# Patient Record
Sex: Female | Born: 1950 | Race: White | Hispanic: No | Marital: Married | Smoking: Never smoker
Health system: Southern US, Community
[De-identification: ages and names within clinical notes are randomized; demographics above are authoritative.]

## PROBLEM LIST (undated history)

## (undated) DIAGNOSIS — F419 Anxiety disorder, unspecified: Secondary | ICD-10-CM

## (undated) DIAGNOSIS — N189 Chronic kidney disease, unspecified: Secondary | ICD-10-CM

## (undated) DIAGNOSIS — Q600 Renal agenesis, unilateral: Secondary | ICD-10-CM

## (undated) DIAGNOSIS — K649 Unspecified hemorrhoids: Secondary | ICD-10-CM

## (undated) DIAGNOSIS — I10 Essential (primary) hypertension: Secondary | ICD-10-CM

## (undated) DIAGNOSIS — F341 Dysthymic disorder: Secondary | ICD-10-CM

## (undated) DIAGNOSIS — L900 Lichen sclerosus et atrophicus: Secondary | ICD-10-CM

## (undated) DIAGNOSIS — N63 Unspecified lump in unspecified breast: Secondary | ICD-10-CM

## (undated) HISTORY — DX: Chronic kidney disease, unspecified: N18.9

## (undated) HISTORY — DX: Dysthymic disorder: F34.1

## (undated) HISTORY — DX: Anxiety disorder, unspecified: F41.9

## (undated) HISTORY — DX: Essential (primary) hypertension: I10

## (undated) HISTORY — PX: NASAL POLYP SURGERY: SHX186

## (undated) HISTORY — DX: Renal agenesis, unilateral: Q60.0

## (undated) HISTORY — PX: ABDOMINAL HYSTERECTOMY: SHX81

## (undated) HISTORY — DX: Unspecified hemorrhoids: K64.9

## (undated) HISTORY — DX: Lichen sclerosus et atrophicus: L90.0

## (undated) HISTORY — DX: Unspecified lump in unspecified breast: N63.0

---

## 2007-02-07 ENCOUNTER — Ambulatory Visit: Payer: Self-pay | Admitting: Family Medicine

## 2008-04-09 ENCOUNTER — Ambulatory Visit: Payer: Self-pay | Admitting: Family Medicine

## 2009-06-10 ENCOUNTER — Ambulatory Visit: Payer: Self-pay | Admitting: Family Medicine

## 2009-06-12 ENCOUNTER — Ambulatory Visit: Payer: Self-pay | Admitting: Family Medicine

## 2009-10-16 ENCOUNTER — Ambulatory Visit: Payer: Self-pay | Admitting: Family Medicine

## 2010-03-22 HISTORY — PX: BREAST CYST ASPIRATION: SHX578

## 2010-12-21 ENCOUNTER — Ambulatory Visit: Payer: Self-pay | Admitting: Family Medicine

## 2012-01-05 IMAGING — US ULTRASOUND LEFT BREAST
1 series · 17 of 25 positions shown · non-contrast
Comparison: none

REASON FOR EXAM: lt brst cyst 2 and 3 oclock and yearly
COMMENTS:

[Series 1: ultrasound left breast · 17 of 59 slices shown]
[im 1/59]
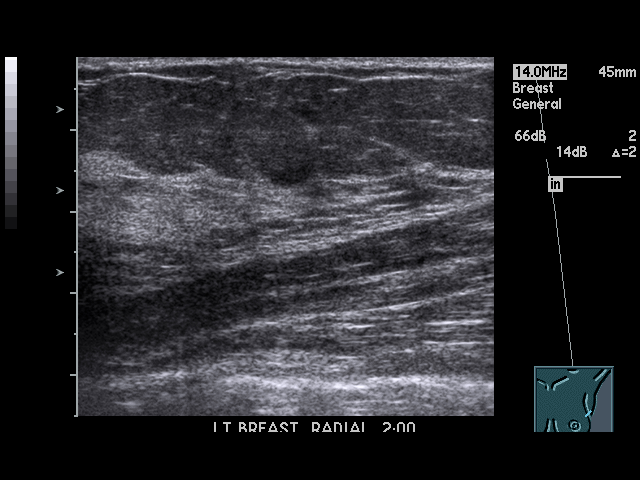
[im 5/59]
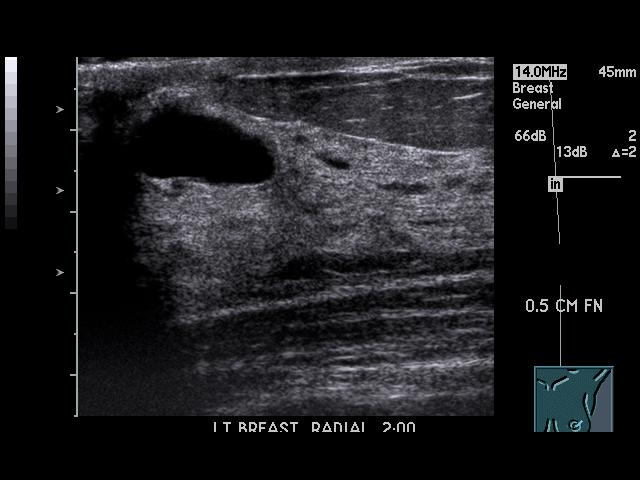
[im 8/59]
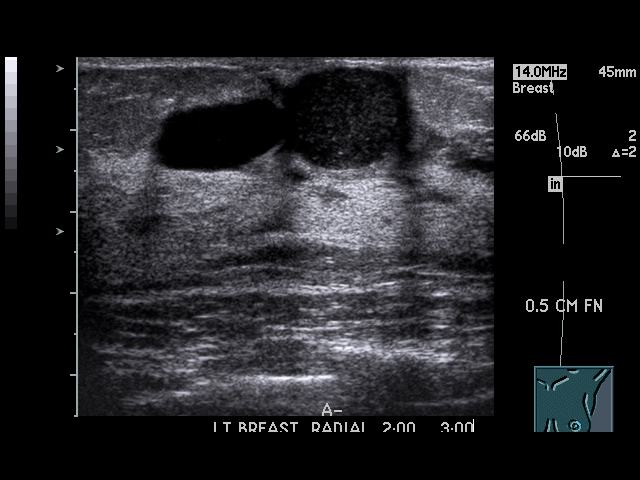
[im 13/59]
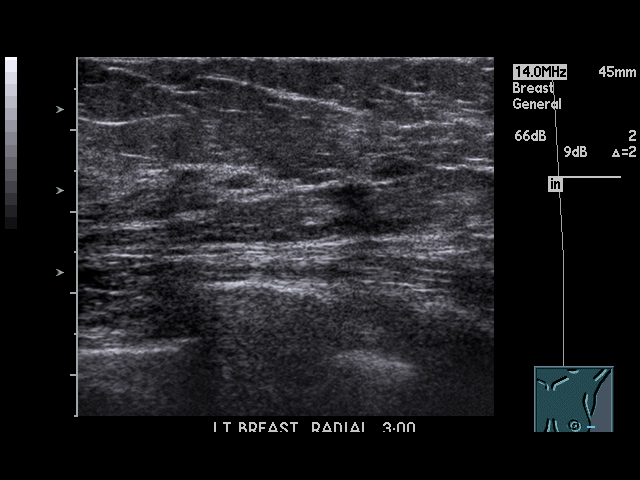
[im 15/59]
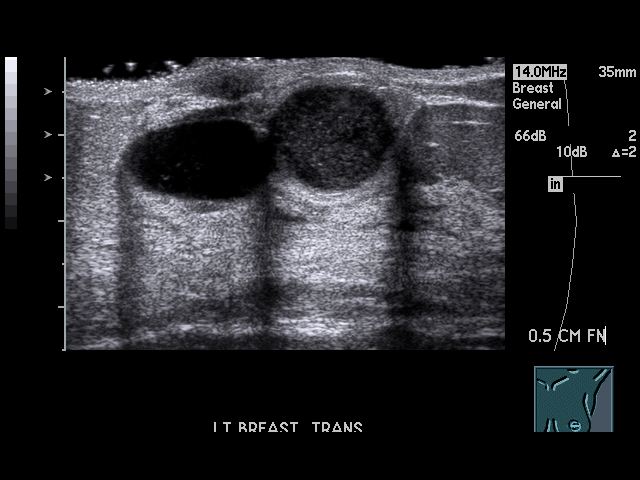
[im 20/59]
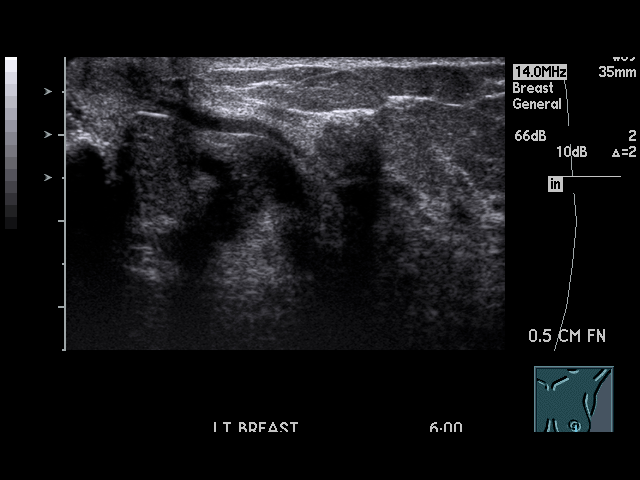
[im 22/59]
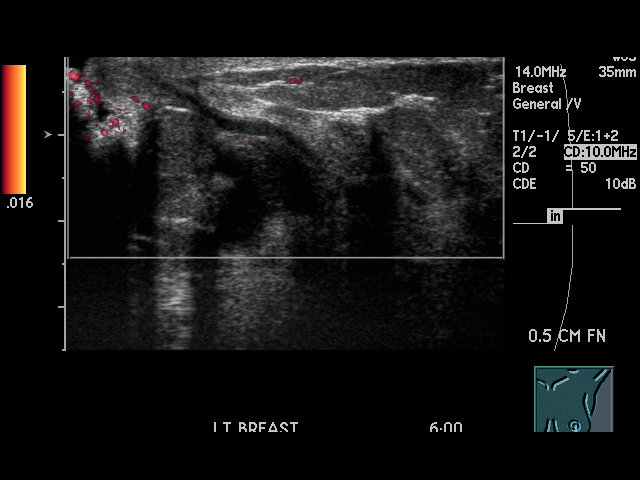
[im 27/59]
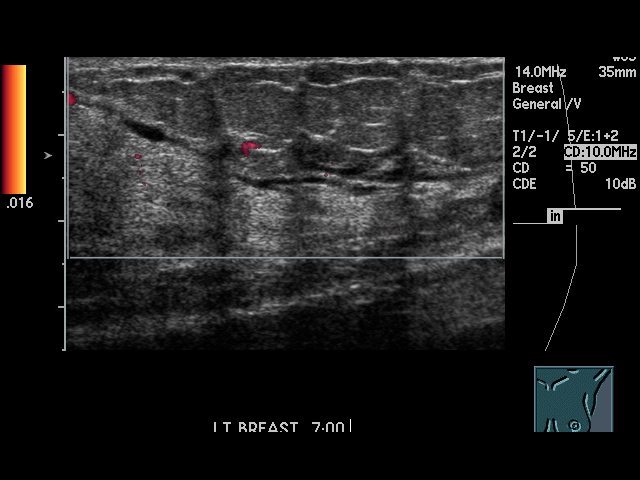
[im 30/59]
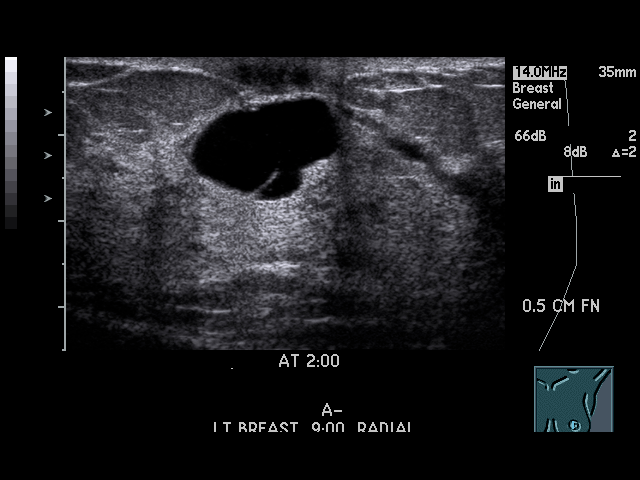
[im 32/59]
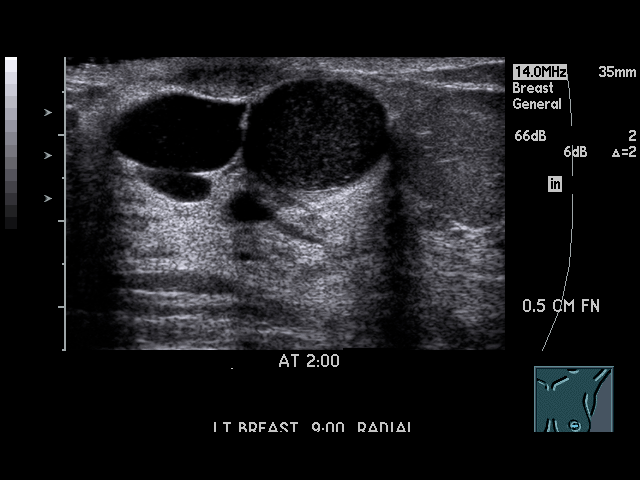
[im 37/59]
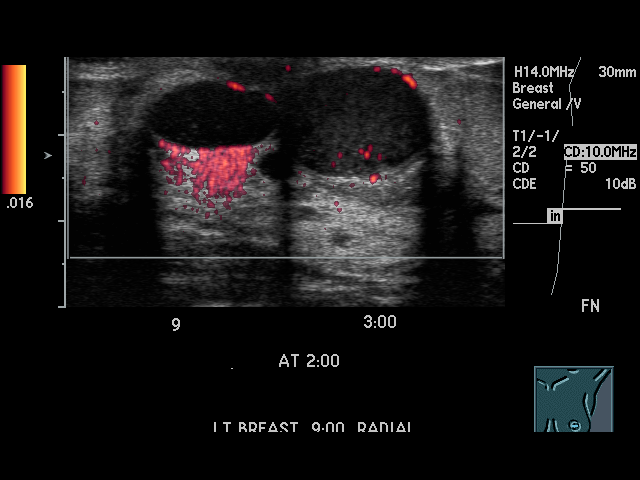
[im 39/59]
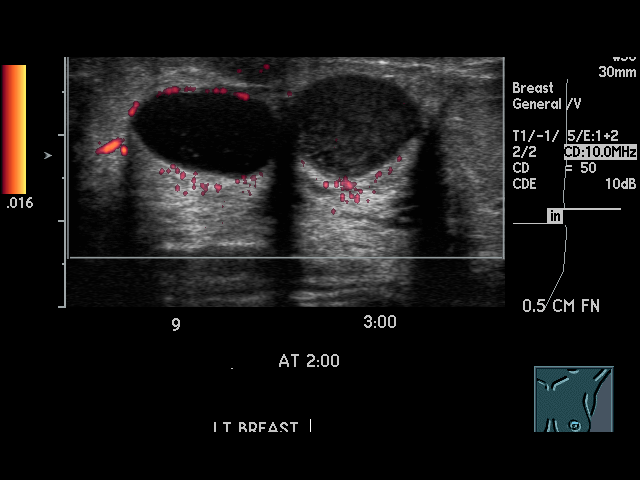
[im 44/59]
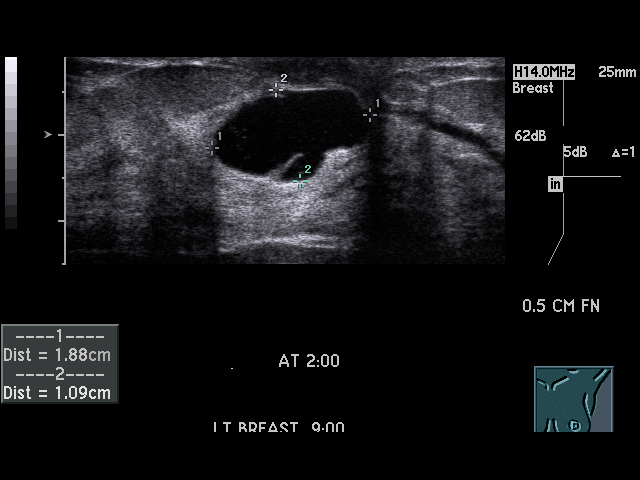
[im 46/59]
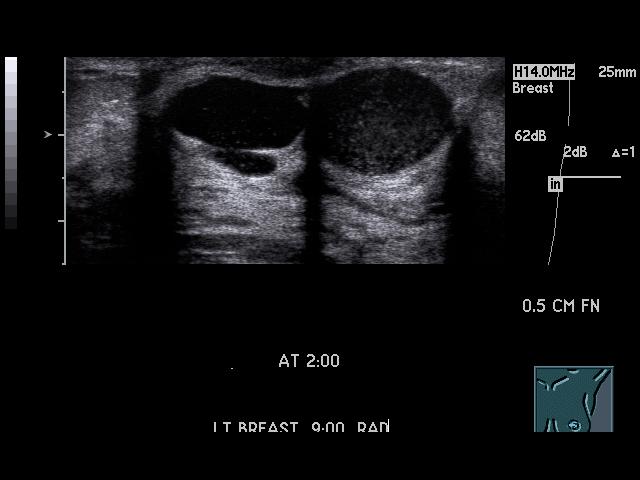
[im 51/59]
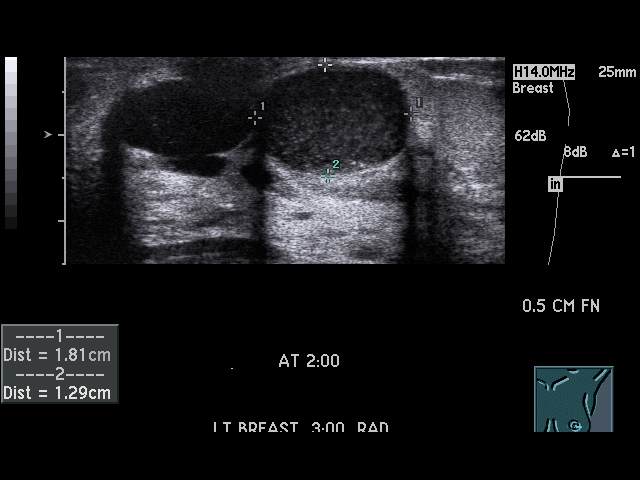
[im 54/59]
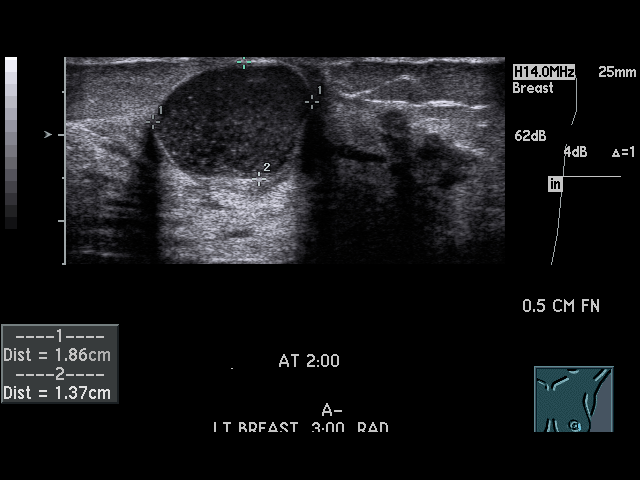
[im 59/59]
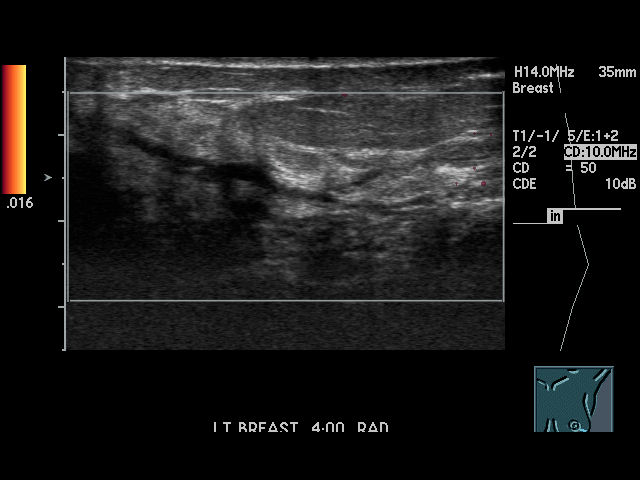

[17 of 25 positions shown; findings below may reference images not displayed]

PROCEDURE:     US  - US BREAST LEFT  - December 21, 2010  [DATE]

RESULT:     Left Breast Ultrasound and additional views of the left breast
were obtained at the time of the patient's visit and not evaluated on the
prior report. Noted in the retroareolar portion of the left breast are
prominent nodular densities. Ultrasound revealed two, approximately 1.9 cm
cystic lesions that contain septations and debris. Prominent adjacent ducts
are noted. Although these lesions are most likely benign,malignancy can not
be excluded.Surgical consultation suggested.
IMPRESSION: BIRADS:4.Surgical consultation suggested.

## 2012-11-20 ENCOUNTER — Emergency Department: Payer: Self-pay | Admitting: Emergency Medicine

## 2012-11-20 LAB — COMPREHENSIVE METABOLIC PANEL
Albumin: 4.5 g/dL (ref 3.4–5.0)
Alkaline Phosphatase: 106 U/L (ref 50–136)
BUN: 17 mg/dL (ref 7–18)
Bilirubin,Total: 0.7 mg/dL (ref 0.2–1.0)
Chloride: 106 mmol/L (ref 98–107)
Co2: 24 mmol/L (ref 21–32)
Creatinine: 1.14 mg/dL (ref 0.60–1.30)
EGFR (Non-African Amer.): 51 — ABNORMAL LOW
Glucose: 113 mg/dL — ABNORMAL HIGH (ref 65–99)
Potassium: 3.8 mmol/L (ref 3.5–5.1)
SGOT(AST): 29 U/L (ref 15–37)
Sodium: 139 mmol/L (ref 136–145)

## 2012-11-20 LAB — URINALYSIS, COMPLETE
Glucose,UR: NEGATIVE mg/dL (ref 0–75)
Nitrite: NEGATIVE
Ph: 7 (ref 4.5–8.0)
RBC,UR: NONE SEEN /HPF (ref 0–5)
Squamous Epithelial: 1

## 2012-11-20 LAB — CK TOTAL AND CKMB (NOT AT ARMC)
CK, Total: 88 U/L (ref 21–215)
CK-MB: 1.7 ng/mL (ref 0.5–3.6)

## 2012-11-20 LAB — TROPONIN I: Troponin-I: <0.02 ng/mL

## 2012-11-20 LAB — CBC
MCHC: 35 g/dL (ref 32.0–36.0)
MCV: 92 fL (ref 80–100)
Platelet: 129 10*3/uL — ABNORMAL LOW (ref 150–440)
RDW: 12.3 % (ref 11.5–14.5)
WBC: 13 10*3/uL — ABNORMAL HIGH (ref 3.6–11.0)

## 2013-03-19 ENCOUNTER — Ambulatory Visit: Payer: Self-pay | Admitting: Gastroenterology

## 2013-03-30 ENCOUNTER — Ambulatory Visit: Payer: Self-pay | Admitting: Gastroenterology

## 2013-04-04 LAB — PATHOLOGY REPORT

## 2014-07-23 ENCOUNTER — Other Ambulatory Visit: Payer: Self-pay | Admitting: Family Medicine

## 2014-07-23 DIAGNOSIS — R928 Other abnormal and inconclusive findings on diagnostic imaging of breast: Secondary | ICD-10-CM

## 2014-08-15 ENCOUNTER — Ambulatory Visit: Payer: Self-pay

## 2014-08-16 ENCOUNTER — Ambulatory Visit: Payer: Self-pay

## 2014-08-16 ENCOUNTER — Ambulatory Visit

## 2014-08-16 ENCOUNTER — Ambulatory Visit
Admission: RE | Admit: 2014-08-16 | Discharge: 2014-08-16 | Disposition: A | Discharge status: Home / Self Care | Source: Ambulatory Visit | Attending: Family Medicine | Admitting: Family Medicine

## 2014-08-16 DIAGNOSIS — R928 Other abnormal and inconclusive findings on diagnostic imaging of breast: Secondary | ICD-10-CM

## 2015-07-14 ENCOUNTER — Other Ambulatory Visit: Payer: Self-pay | Admitting: Family Medicine

## 2015-07-14 DIAGNOSIS — Z1231 Encounter for screening mammogram for malignant neoplasm of breast: Secondary | ICD-10-CM

## 2015-08-19 ENCOUNTER — Ambulatory Visit: Attending: Family Medicine

## 2015-08-25 ENCOUNTER — Ambulatory Visit
Admission: RE | Admit: 2015-08-25 | Discharge: 2015-08-25 | Disposition: A | Discharge status: Home / Self Care | Payer: Medicare Other | Source: Ambulatory Visit | Attending: Family Medicine | Admitting: Family Medicine

## 2015-08-25 ENCOUNTER — Other Ambulatory Visit: Payer: Self-pay | Admitting: Family Medicine

## 2015-08-25 DIAGNOSIS — Z1231 Encounter for screening mammogram for malignant neoplasm of breast: Secondary | ICD-10-CM

## 2015-08-25 DIAGNOSIS — R928 Other abnormal and inconclusive findings on diagnostic imaging of breast: Secondary | ICD-10-CM | POA: Diagnosis not present

## 2015-09-02 ENCOUNTER — Other Ambulatory Visit: Payer: Self-pay | Admitting: Family Medicine

## 2015-09-02 DIAGNOSIS — R928 Other abnormal and inconclusive findings on diagnostic imaging of breast: Secondary | ICD-10-CM

## 2015-09-09 ENCOUNTER — Ambulatory Visit
Admission: RE | Admit: 2015-09-09 | Discharge: 2015-09-09 | Disposition: A | Discharge status: Home / Self Care | Payer: Medicare Other | Source: Ambulatory Visit | Attending: Family Medicine | Admitting: Family Medicine

## 2015-09-09 DIAGNOSIS — N6002 Solitary cyst of left breast: Secondary | ICD-10-CM | POA: Insufficient documentation

## 2015-09-09 DIAGNOSIS — R928 Other abnormal and inconclusive findings on diagnostic imaging of breast: Secondary | ICD-10-CM

## 2015-09-09 DIAGNOSIS — N63 Unspecified lump in breast: Secondary | ICD-10-CM | POA: Insufficient documentation

## 2015-10-09 ENCOUNTER — Ambulatory Visit (INDEPENDENT_AMBULATORY_CARE_PROVIDER_SITE_OTHER): Payer: Medicare Other | Admitting: Psychiatry

## 2015-10-09 ENCOUNTER — Encounter: Payer: Self-pay | Admitting: Psychiatry

## 2015-10-09 VITALS — BP 151/73 | HR 61 | Temp 97.6°F | Ht 64.0 in | Wt 161.8 lb

## 2015-10-09 DIAGNOSIS — I5181 Takotsubo syndrome: Secondary | ICD-10-CM | POA: Insufficient documentation

## 2015-10-09 DIAGNOSIS — K649 Unspecified hemorrhoids: Secondary | ICD-10-CM | POA: Insufficient documentation

## 2015-10-09 DIAGNOSIS — Q600 Renal agenesis, unilateral: Secondary | ICD-10-CM | POA: Insufficient documentation

## 2015-10-09 DIAGNOSIS — I10 Essential (primary) hypertension: Secondary | ICD-10-CM | POA: Insufficient documentation

## 2015-10-09 DIAGNOSIS — F4323 Adjustment disorder with mixed anxiety and depressed mood: Secondary | ICD-10-CM | POA: Diagnosis not present

## 2015-10-09 DIAGNOSIS — F341 Dysthymic disorder: Secondary | ICD-10-CM | POA: Insufficient documentation

## 2015-10-09 NOTE — Progress Notes (Signed)
Psychiatric Initial Adult Assessment   Patient Identification: Patricia Cowan MRN:  [id number] Date of Evaluation:  10/09/2015 [facility] [place] Chief Complaint:   Chief Complaint    Establish Care; Panic Attack; Stress     Visit Diagnosis:    ICD-9-CM ICD-10-CM   1. Adjustment disorder with mixed anxiety and depressed mood 309.28 F43.23     History of Present Illness:    Patient is a 65 year old married female who presented for initial assessment. She was referred by her primary care physician. Patient reported that she has been having several stressors in her life. She stated that she has been stressed out because of her husband who was diagnosed with her for a risk or disease and lost his leg earlier this year. She he had 2 surgeries and also had 2 seizures. She reported that she is also having stress related to her daughter who was married for almost 15 years and then she is getting divorce related to her alcohol use as well as relationship issues with her husband. Her daughter has 3 children ages 267, 115 and 66 years old. Her daughter has started drinking excessively and going to the bars on a daily basis. Patient was sad and tearful due to the behavior of her daughter. She reported that her daughter is not listening to any of the family members and they have been trying to explain to her about the excessive use of alcohol as well as the consequences. She reported that she has also tried to take the kids to the bar and they stated to them that other people also bring the kids to the bar in the [place] area. She reported that she feels stressed out. Her husband was a nice man but he could not tolerate the behavior of her daughter. Patient reported that she wants help for her daughter. She remained focus on the issues related to her daughter. She reported that she wants help for her. Patient reported that she has been taking Lexapro as prescribed by her primary physician and is  doing well on that medication. She currently denied having any suicidal homicidal ideations or plans.  Associated Signs/Symptoms: Depression Symptoms:  depressed mood, fatigue, feelings of worthlessness/guilt, difficulty concentrating, hopelessness, anxiety, disturbed sleep, (Hypo) Manic Symptoms:  Labiality of Mood, Anxiety Symptoms:  Excessive Worry, Psychotic Symptoms:  none PTSD Symptoms: Had a traumatic exposure:  son died 15 years ago- he was 66 years old, he was intoxicated  Past Psychiatric History:  Pt  reported that she has been in therapy through [facility] related to her relationship problems with her husband in the past. She denied any history of suicide attempts. She denied any history of psychiatric hospitalization.  Previous Psychotropic Medications: lexapro   Substance Abuse History in the last 12 months:  No.  Consequences of Substance Abuse: Negative NA  Past Medical History:  Past Medical History  Diagnosis Date  . Chronic kidney disease   . HTN (hypertension)   . Congenital single kidney   . Hemorrhoid   . Dysthymia     Past Surgical History  Procedure Laterality Date  . Breast cyst aspiration Left 2012    neg (performed 2 times)  . Nasal polyp surgery    . Abdominal hysterectomy      Family Psychiatric History:  ADD- Daughter, brother Depression- Mother  Son and daughter- alcohol use  Family History:  Family History  Problem Relation Age of Onset  . Breast cancer Paternal Aunt [id number]  .  Breast cancer Cousin   . Depression Mother   . Depression Sister   . ADD / ADHD Brother     Social History:   Social History   Social History  . Marital Status: Married    Spouse Name: N/A  . Number of Children: N/A  . Years of Education: N/A   Social History Main Topics  . Smoking status: Never Smoker   . Smokeless tobacco: Never Used  . Alcohol Use: 0.6 - 1.8 oz/week    0 Standard drinks or equivalent, 1-2 Cans of beer, 0-1 Glasses of wine per  week  . Drug Use: No  . Sexual Activity: Yes    Birth Control/ Protection: None   Other Topics Concern  . None   Social History Narrative    Additional Social History:  Married x 2- First husband left when daughter was 7 months old.  15 years ago lost son - car accident Has  2 daughters.  Lives with husband.   Allergies:   Allergies  Allergen Reactions  . Codeine Hives  . Penicillins Rash    Metabolic Disorder Labs: No results found for: HGBA1C, MPG No results found for: PROLACTIN No results found for: CHOL, TRIG, HDL, CHOLHDL, VLDL, LDLCALC   Current Medications: Current Outpatient Prescriptions  Medication Sig Dispense Refill  . aspirin 81 MG tablet Take 81 mg by mouth daily.    . calcium-vitamin D 250-100 MG-UNIT tablet Take 1 tablet by mouth 2 (two) times daily.    Marland Kitchen escitalopram (LEXAPRO) 20 MG tablet     . glucosamine-chondroitin 500-400 MG tablet Take 1 tablet by mouth 3 (three) times daily.    Marland Kitchen lisinopril (PRINIVIL,ZESTRIL) 10 MG tablet     . magnesium 30 MG tablet Take 30 mg by mouth 2 (two) times daily.    . Methylsulfonylmethane (MSM) 750 MG CAPS Take by mouth.    . Omega-3 Fatty Acids (FISH OIL) 1000 MG CAPS Take by mouth.    Marland Kitchen omeprazole (PRILOSEC) 20 MG capsule     . VESICARE 5 MG tablet      No current facility-administered medications for this visit.    Neurologic: Headache: No Seizure: No Paresthesias:No  Musculoskeletal: Strength & Muscle Tone: within normal limits Gait & Station: normal Patient leans: N/A  Psychiatric Specialty Exam: ROS  Blood pressure 151/73, pulse 61, temperature 97.6 F (36.4 C), temperature source Oral, height 5\' 4"  (1.626 m), weight 161 lb 12.8 oz (73.392 kg).Body mass index is 27.76 kg/(m^2).  General Appearance: Casual  Eye Contact:  Fair  Speech:  Slow  Volume:  Decreased  Mood:  Anxious and Depressed  Affect:  Tearful  Thought Process:  Coherent  Orientation:  Full (Time, Place, and Person)  Thought  Content:  WDL  Suicidal Thoughts:  No  Homicidal Thoughts:  No  Memory:  Immediate;   Fair Recent;   Fair  Judgement:  Good  Insight:  Fair  Psychomotor Activity:  Normal  Concentration:  Concentration: Fair and Attention Span: Fair  Recall:  [employer] of Knowledge:Fair  Language: Fair  Akathisia:  No  Handed:  Right  AIMS (if indicated):    Assets:  Communication Skills Desire for Improvement Physical Health Social Support  ADL's:  Intact  Cognition: WNL  Sleep:      Treatment Plan Summary: Medication management   Patient reported that she is doing well on her medications and stable on the medications. She wants help for her daughter. She currently denied any suicidal  ideation or plans. Joni Reining will talk to her about resources including [facility] and [facility] health for her daughter.  Will follow-up as needed.   More than 50% of the time spent in psychoeducation, counseling and coordination of care.    This note was generated in part or whole with voice recognition software. Voice regonition is usually quite accurate but there are transcription errors that can and very often do occur. I apologize for any typographical errors that were not detected and corrected.    Brandy Hale, MD 7/20/20173:10 PM

## 2016-02-02 ENCOUNTER — Emergency Department
Admission: EM | Admit: 2016-02-02 | Discharge: 2016-02-02 | Disposition: A | Discharge status: Home / Self Care | Payer: Medicare Other | Attending: Emergency Medicine | Admitting: Emergency Medicine

## 2016-02-02 ENCOUNTER — Encounter: Payer: Self-pay | Admitting: Emergency Medicine

## 2016-02-02 DIAGNOSIS — N189 Chronic kidney disease, unspecified: Secondary | ICD-10-CM | POA: Insufficient documentation

## 2016-02-02 DIAGNOSIS — Z79899 Other long term (current) drug therapy: Secondary | ICD-10-CM | POA: Diagnosis not present

## 2016-02-02 DIAGNOSIS — I129 Hypertensive chronic kidney disease with stage 1 through stage 4 chronic kidney disease, or unspecified chronic kidney disease: Secondary | ICD-10-CM | POA: Diagnosis not present

## 2016-02-02 DIAGNOSIS — Z7982 Long term (current) use of aspirin: Secondary | ICD-10-CM | POA: Insufficient documentation

## 2016-02-02 DIAGNOSIS — R112 Nausea with vomiting, unspecified: Secondary | ICD-10-CM | POA: Diagnosis present

## 2016-02-02 DIAGNOSIS — K529 Noninfective gastroenteritis and colitis, unspecified: Secondary | ICD-10-CM | POA: Insufficient documentation

## 2016-02-02 LAB — COMPREHENSIVE METABOLIC PANEL
ALBUMIN: 4.3 g/dL (ref 3.5–5.0)
ALK PHOS: 84 U/L (ref 38–126)
ALT: 34 U/L (ref 14–54)
AST: 32 U/L (ref 15–41)
Anion gap: 11 (ref 5–15)
BUN: 20 mg/dL (ref 6–20)
CALCIUM: 9.6 mg/dL (ref 8.9–10.3)
CHLORIDE: 102 mmol/L (ref 101–111)
CO2: 24 mmol/L (ref 22–32)
CREATININE: 0.93 mg/dL (ref 0.44–1.00)
GFR calc Af Amer: >60 mL/min (ref >60)
GFR calc non Af Amer: >60 mL/min (ref >60)
GLUCOSE: 119 mg/dL — AB (ref 65–99)
Potassium: 4.1 mmol/L (ref 3.5–5.1)
SODIUM: 137 mmol/L (ref 135–145)
Total Bilirubin: 1.4 mg/dL — ABNORMAL HIGH (ref 0.3–1.2)
Total Protein: 7.4 g/dL (ref 6.5–8.1)

## 2016-02-02 LAB — URINALYSIS COMPLETE WITH MICROSCOPIC (ARMC ONLY)
Bacteria, UA: NONE SEEN
Bilirubin Urine: NEGATIVE
Glucose, UA: NEGATIVE mg/dL
Hgb urine dipstick: NEGATIVE
Leukocytes, UA: NEGATIVE
Nitrite: NEGATIVE
PROTEIN: 30 mg/dL — AB
Specific Gravity, Urine: 1.025 (ref 1.005–1.030)
pH: 5 (ref 5.0–8.0)

## 2016-02-02 LAB — CBC
HCT: 48.4 % — ABNORMAL HIGH (ref 35.0–47.0)
HEMOGLOBIN: 16.8 g/dL — AB (ref 12.0–16.0)
MCH: 32.3 pg (ref 26.0–34.0)
MCHC: 34.6 g/dL (ref 32.0–36.0)
MCV: 93.2 fL (ref 80.0–100.0)
Platelets: 152 10*3/uL (ref 150–440)
RBC: 5.2 MIL/uL (ref 3.80–5.20)
RDW: 12.5 % (ref 11.5–14.5)
WBC: 12.9 10*3/uL — ABNORMAL HIGH (ref 3.6–11.0)

## 2016-02-02 LAB — LIPASE, BLOOD: Lipase: 19 U/L (ref 11–51)

## 2016-02-02 MED ORDER — ACETAMINOPHEN 325 MG PO TABS
650.0000 mg | ORAL_TABLET | Freq: Once | ORAL | Status: AC
  Administered 2016-02-02: 650 mg via ORAL

## 2016-02-02 MED ORDER — SODIUM CHLORIDE 0.9 % IV BOLUS (SEPSIS)
1000.0000 mL | Freq: Once | INTRAVENOUS | Status: AC
  Administered 2016-02-02: 1000 mL via INTRAVENOUS

## 2016-02-02 MED ORDER — ONDANSETRON HCL 4 MG PO TABS: 4.0000 mg | ORAL_TABLET | Freq: Three times a day (TID) | ORAL | 0 refills | Status: DC | PRN

## 2016-02-02 MED ORDER — ONDANSETRON HCL 4 MG/2ML IJ SOLN
4.0000 mg | Freq: Once | INTRAMUSCULAR | Status: AC | PRN
  Administered 2016-02-02: 4 mg via INTRAVENOUS
  Filled 2016-02-02: qty 2

## 2016-02-02 MED ORDER — ACETAMINOPHEN 325 MG PO TABS
ORAL_TABLET | ORAL | Status: AC
  Filled 2016-02-02: qty 2

## 2016-02-02 MED ORDER — ONDANSETRON HCL 4 MG/2ML IJ SOLN
4.0000 mg | Freq: Once | INTRAMUSCULAR | Status: AC
  Administered 2016-02-02: 4 mg via INTRAVENOUS
  Filled 2016-02-02: qty 2

## 2016-02-02 NOTE — ED Notes (Signed)
Pt reports that she did not take her BP medications this am.

## 2016-02-02 NOTE — ED Provider Notes (Addendum)
[facility] Emergency Department Provider Note  ____________________________________________   I have reviewed the triage vital signs and the nursing notes.   HISTORY  Chief Complaint Abdominal Pain    HPI Patricia Cowan is a 65 y.o. female with a retention and mild kidney disease presents today complaining of nausea vomiting diarrhea since this morning. Nonbloody nonbilious. No melena bright red blood per rectum. No hematemesis. The patient states that she has had no recent antibiotics she denies abdominal pain she would like to try ice chips she feels dehydrated. She states that she has had no fever. She denies antibiotics or recent travel.       Past Medical History:  Diagnosis Date  . Chronic kidney disease   . Congenital single kidney   . Dysthymia   . Hemorrhoid   . HTN (hypertension)     Patient Active Problem List   Diagnosis Date Noted  . 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 Congenital absence of one kidney 10/09/2015  . Dysthymia 10/09/2015  . Hemorrhoid 10/09/2015  . BP (high blood pressure) 10/09/2015    Past Surgical History:  Procedure Laterality Date  . ABDOMINAL HYSTERECTOMY    . BREAST CYST ASPIRATION Left 2012   neg (performed 2 times)  . NASAL POLYP SURGERY      Prior to Admission medications   Medication Sig Start Date End Date Taking? Authorizing Provider  aspirin 81 MG tablet Take 81 mg by mouth daily.    Historical Provider, MD  calcium-vitamin D 250-100 MG-UNIT tablet Take 1 tablet by mouth 2 (two) times daily.    Historical Provider, MD  escitalopram (LEXAPRO) 20 MG tablet  09/23/15   Historical Provider, MD  glucosamine-chondroitin 500-400 MG tablet Take 1 tablet by mouth 3 (three) times daily.    Historical Provider, MD  lisinopril (PRINIVIL,ZESTRIL) 10 MG tablet  07/07/15   Historical Provider, MD  magnesium 30 MG tablet Take 30 mg by mouth 2 (two) times daily.    Historical Provider, MD   Methylsulfonylmethane (MSM) 750 MG CAPS Take by mouth.    Historical Provider, MD  Omega-3 Fatty Acids (FISH OIL) 1000 MG CAPS Take by mouth.    Historical Provider, MD  omeprazole (PRILOSEC) 20 MG capsule  08/19/15   Historical Provider, MD  VESICARE 5 MG tablet  10/05/15   Historical Provider, MD    Allergies Codeine and Penicillins  Family History  Problem Relation Age of Onset  . Breast cancer Cousin   . Depression Mother   . Depression Sister   . ADD / ADHD Brother   . Breast cancer Paternal Aunt 80    Social History Social History  Substance Use Topics  . Smoking status: Never Smoker  . Smokeless tobacco: Never Used  . Alcohol use 0.6 - 1.8 oz/week    1 - 2 Cans of beer per week    Review of Systems Constitutional: No fever/chills Eyes: No visual changes. ENT: No sore throat. No stiff neck no neck pain Cardiovascular: Denies chest pain. Respiratory: Denies shortness of breath. Gastrointestinal:   no vomiting.  No diarrhea.  No constipation. Genitourinary: Negative for dysuria. Musculoskeletal: Negative lower extremity swelling Skin: Negative for rash. Neurological: Negative for severe headaches, focal weakness or numbness. 10-point ROS otherwise negative.  ____________________________________________   PHYSICAL EXAM:  VITAL SIGNS: ED Triage Vitals [02/02/16 1801]  Enc Vitals Group     BP (!) 151/91     Pulse Rate 83     Resp 18  Temp 99.3 F (37.4 C)     Temp Source Oral     SpO2 97 %     Weight 158 lb (71.7 kg)     Height 5\' 3"  (1.6 m)     Head Circumference      Peak Flow      Pain Score 5     Pain Loc      Pain Edu?      Excl. in GC?     Constitutional: Alert and oriented. Well appearing and in no acute distress. Eyes: Conjunctivae are normal. PERRL. EOMI. Head: Atraumatic. Nose: No congestion/rhinnorhea. Mouth/Throat: Mucous membranes are Dry.  Oropharynx non-erythematous. Neck: No stridor.   Nontender with no  meningismus Cardiovascular: Normal rate, regular rhythm. Grossly normal heart sounds.  Good peripheral circulation. Respiratory: Normal respiratory effort.  No retractions. Lungs CTAB. Abdominal: Soft and nontender. No distention. No guarding no rebound Back:  There is no focal tenderness or step off.  there is no midline tenderness there are no lesions noted. there is no CVA tenderness  Musculoskeletal: No lower extremity tenderness, no upper extremity tenderness. No joint effusions, no DVT signs strong distal pulses no edema Neurologic:  Normal speech and language. No gross focal neurologic deficits are appreciated.  Skin:  Skin is warm, dry and intact. No rash noted. Psychiatric: Mood and affect are normal. Speech and behavior are normal.  ____________________________________________   LABS (all labs ordered are listed, but only abnormal results are displayed)  Labs Reviewed  COMPREHENSIVE METABOLIC PANEL - Abnormal; Notable for the following:       Result Value   Glucose, Bld 119 (*)    Total Bilirubin 1.4 (*)    All other components within normal limits  CBC - Abnormal; Notable for the following:    WBC 12.9 (*)    Hemoglobin 16.8 (*)    HCT 48.4 (*)    All other components within normal limits  URINALYSIS COMPLETEWITH MICROSCOPIC ([facility] ONLY) - Abnormal; Notable for the following:    Color, Urine YELLOW (*)    APPearance CLEAR (*)    Ketones, ur 1+ (*)    Protein, ur 30 (*)    Squamous Epithelial / LPF 0-5 (*)    All other components within normal limits  LIPASE, BLOOD   ____________________________________________  EKG  I personally interpreted any EKGs ordered by me or triage  ____________________________________________  RADIOLOGY  I reviewed any imaging ordered by me or triage that were performed during my shift and, if possible, patient and/or family made aware of any abnormal  findings. ____________________________________________   PROCEDURES  Procedure(s) performed: None  Procedures  Critical Care performed: None  ____________________________________________   INITIAL IMPRESSION / ASSESSMENT AND PLAN / ED COURSE  Pertinent labs & imaging results that were available during my care of the patient were reviewed by me and considered in my medical decision making (see chart for details).  Patient here with nausea vomiting and diarrhea, abdomen is benign, she does look hemoconcentrated but is preserved otherwise her blood work findings thus far. Reassuring vitals. We'll give her IV fluid and reassess.[facility] ----------------------------------------- 11:21 PM on 02/02/2016 -----------------------------------------  Patient feeling much better at this time, abdominal exam is reassuring, no abdominal tenderness noted. Blood work is reassuring, blood pressure somewhat elevated the patient has not taken her blood pressure medication today. We have advised her to do so. Do not think nausea vomiting diarrhea reflects an acute intrathoracic pathology. We will discharge the patient home with close outpatient  follow-up. She is very comfortable with this plan.  Clinical Course    ____________________________________________   FINAL CLINICAL IMPRESSION(S) / ED DIAGNOSES  Final diagnoses:  None      This chart was dictated using voice recognition software.  Despite best efforts to proofread,  errors can occur which can change meaning.      Jeanmarie PlantJames A Ontario Pettengill, MD 02/02/16 2049    Jeanmarie PlantJames A Monique Hefty, MD 02/02/16 2322

## 2016-02-02 NOTE — ED Triage Notes (Signed)
At 4 am began nausea, vomiting and diarrhea and abdominal cramping.

## 2016-02-02 NOTE — ED Notes (Signed)
Pt held down PO fluids with no issue. MD McShane notified

## 2016-02-02 NOTE — ED Notes (Signed)
Pt reports nausea medicine effective. Pt reports no episodes of vomiting or diarrhea since arriving to ED

## 2016-02-02 NOTE — ED Notes (Signed)
MD McShane request PO Challenge. Pt given water. RN will continue to monitor

## 2016-02-02 NOTE — ED Notes (Signed)
Reviewed d/c instructions, follow-up care and prescriptions with pt. Pt verbalized understanding 

## 2016-08-18 ENCOUNTER — Other Ambulatory Visit: Payer: Self-pay | Admitting: Family Medicine

## 2016-08-18 DIAGNOSIS — Z1231 Encounter for screening mammogram for malignant neoplasm of breast: Secondary | ICD-10-CM

## 2016-09-03 ENCOUNTER — Ambulatory Visit
Admission: RE | Admit: 2016-09-03 | Discharge: 2016-09-03 | Disposition: A | Discharge status: Home / Self Care | Payer: Medicare Other | Source: Ambulatory Visit | Attending: Family Medicine | Admitting: Family Medicine

## 2016-09-03 DIAGNOSIS — Z1231 Encounter for screening mammogram for malignant neoplasm of breast: Secondary | ICD-10-CM | POA: Insufficient documentation

## 2017-08-25 ENCOUNTER — Other Ambulatory Visit: Payer: Self-pay | Admitting: Family Medicine

## 2017-08-25 DIAGNOSIS — Z1231 Encounter for screening mammogram for malignant neoplasm of breast: Secondary | ICD-10-CM

## 2017-11-08 ENCOUNTER — Ambulatory Visit
Admission: RE | Admit: 2017-11-08 | Discharge: 2017-11-08 | Disposition: A | Discharge status: Home / Self Care | Payer: Medicare Other | Source: Ambulatory Visit | Attending: Family Medicine | Admitting: Family Medicine

## 2017-11-08 DIAGNOSIS — Z1231 Encounter for screening mammogram for malignant neoplasm of breast: Secondary | ICD-10-CM | POA: Diagnosis not present

## 2017-11-11 ENCOUNTER — Other Ambulatory Visit: Payer: Self-pay | Admitting: Family Medicine

## 2017-11-11 DIAGNOSIS — R928 Other abnormal and inconclusive findings on diagnostic imaging of breast: Secondary | ICD-10-CM

## 2017-11-17 ENCOUNTER — Ambulatory Visit: Payer: Medicare Other

## 2017-11-17 ENCOUNTER — Other Ambulatory Visit: Payer: Medicare Other

## 2017-11-18 ENCOUNTER — Ambulatory Visit
Admission: RE | Admit: 2017-11-18 | Discharge: 2017-11-18 | Disposition: A | Discharge status: Home / Self Care | Payer: Medicare Other | Source: Ambulatory Visit | Attending: Family Medicine | Admitting: Family Medicine

## 2017-11-18 DIAGNOSIS — R928 Other abnormal and inconclusive findings on diagnostic imaging of breast: Secondary | ICD-10-CM | POA: Diagnosis not present

## 2018-01-12 ENCOUNTER — Ambulatory Visit (INDEPENDENT_AMBULATORY_CARE_PROVIDER_SITE_OTHER): Payer: Medicare Other | Admitting: Obstetrics and Gynecology

## 2018-01-12 ENCOUNTER — Encounter: Payer: Self-pay | Admitting: Obstetrics and Gynecology

## 2018-01-12 ENCOUNTER — Other Ambulatory Visit (HOSPITAL_COMMUNITY)
Admission: RE | Admit: 2018-01-12 | Discharge: 2018-01-12 | Disposition: A | Discharge status: Home / Self Care | Payer: Medicare Other | Source: Ambulatory Visit | Attending: Obstetrics and Gynecology | Admitting: Obstetrics and Gynecology

## 2018-01-12 VITALS — BP 144/70 | HR 52 | Ht 64.0 in | Wt 158.0 lb

## 2018-01-12 DIAGNOSIS — K644 Residual hemorrhoidal skin tags: Secondary | ICD-10-CM

## 2018-01-12 DIAGNOSIS — N952 Postmenopausal atrophic vaginitis: Secondary | ICD-10-CM

## 2018-01-12 DIAGNOSIS — Z124 Encounter for screening for malignant neoplasm of cervix: Secondary | ICD-10-CM | POA: Diagnosis present

## 2018-01-12 DIAGNOSIS — Z01419 Encounter for gynecological examination (general) (routine) without abnormal findings: Secondary | ICD-10-CM

## 2018-01-12 DIAGNOSIS — L900 Lichen sclerosus et atrophicus: Secondary | ICD-10-CM

## 2018-01-12 DIAGNOSIS — Z1239 Encounter for other screening for malignant neoplasm of breast: Secondary | ICD-10-CM

## 2018-01-12 MED ORDER — CLOBETASOL PROPIONATE 0.05 % EX OINT: TOPICAL_OINTMENT | CUTANEOUS | 5 refills | Status: AC

## 2018-01-12 NOTE — Progress Notes (Signed)
PCP: Jerl Mina, MD   Chief Complaint  Patient presents with  . Gynecologic Exam    bad hemorrhoids    HPI:      [name] is a 67 y.o. No obstetric history on file. who LMP was No LMP recorded. Patient has had a hysterectomy., presents today for her NP Medicare annual examination.  Her menses are absent due to [facility] for leio. She does not have intermenstrual bleeding. She does not have vasomotor sx.   Sex activity: single partner, contraception - status post hysterectomy and post menopausal status. She does have vaginal dryness. Has used estrace crm in past but rarely. Husband with ED due to DM, low testosterone. Uses lubricants sometimes.  Also has LS, diagnosed on bx. Needs Rx RF clobetasol--uses prn sx.   Last Pap: not recent; s/p TAH Hx of STDs: none  Last mammogram: November 18, 2017  Results were: normal--routine follow-up in 12 months There is a FH of breast cancer in her pat aunt and pat cousin; genetic testing not indicated. There is no FH of ovarian cancer. The patient does do self-breast exams.  Colonoscopy: "about due"; sched by PCP.  Repeat due after 5 years due to polyps. Hx of hemorrhoids. Needs RF on supp, but doesn't know name of it. I can't find record in our past notes of prescribing. Pt can check with pharmacy for Rx RF request. Sx today more likely due to LS.  Tobacco use: The patient denies current or previous tobacco use. Alcohol use: none Exercise: very active  She does get adequate calcium and Vitamin D in her diet.  Labs with PCP.   Past Medical History:  Diagnosis Date  . Anxiety   . Breast mass 2013   cysts x 2  . Chronic kidney disease   . Congenital single kidney   . Dysthymia   . Hemorrhoid   . HTN (hypertension)   . Lichen sclerosus     Past Surgical History:  Procedure Laterality Date  . ABDOMINAL HYSTERECTOMY    . BREAST CYST ASPIRATION Left 2012   neg (performed 2 times)  . NASAL POLYP SURGERY       Family History  Problem Relation Age of Onset  . Breast cancer Cousin 35       Malignant  . Depression Mother   . Depression Sister   . ADD / ADHD Brother   . Breast cancer Paternal Aunt 6       Malignant    Social History   Socioeconomic History  . Marital status: Married    Spouse name: Not on file  . Number of children: Not on file  . Years of education: Not on file  . Highest education level: Not on file  Occupational History  . Not on file  Social Needs  . Financial resource strain: Not on file  . Food insecurity:    Worry: Not on file    Inability: Not on file  . Transportation needs:    Medical: Not on file    Non-medical: Not on file  Tobacco Use  . Smoking status: Never Smoker  . Smokeless tobacco: Never Used  Substance and Sexual Activity  . Alcohol use: Yes    Alcohol/week: 1.0 - 3.0 standard drinks    Types: 1 - 2 Cans of beer per week    Comment: socially  . Drug use: No  . Sexual activity: Yes    Birth control/protection: Surgical    Comment: Hysterectomy  Lifestyle  . Physical activity:    Days per week: Not on file    Minutes per session: Not on file  . Stress: Not on file  Relationships  . Social connections:    Talks on phone: Not on file    Gets together: Not on file    Attends religious service: Not on file    Active member of club or organization: Not on file    Attends meetings of clubs or organizations: Not on file    Relationship status: Not on file  . Intimate partner violence:    Fear of current or ex partner: Not on file    Emotionally abused: Not on file    Physically abused: Not on file    Forced sexual activity: Not on file  Other Topics Concern  . Not on file  Social History Narrative  . Not on file    Outpatient Medications Prior to Visit  Medication Sig Dispense Refill  . Aspirin-Calcium Carbonate 81-777 MG TABS Take by mouth.    . calcium-vitamin D 250-100 MG-UNIT tablet Take 1 tablet by mouth 2 (two) times  daily.    Marland Kitchen escitalopram (LEXAPRO) 20 MG tablet     . estradiol (ESTRACE) 0.1 MG/GM vaginal cream Place vaginally.    Marland Kitchen glucosamine-chondroitin 500-400 MG tablet Take 1 tablet by mouth 3 (three) times daily.    Marland Kitchen lisinopril (PRINIVIL,ZESTRIL) 10 MG tablet     . Methylsulfonylmethane (MSM) 750 MG CAPS Take by mouth.    . omega-3 acid ethyl esters (LOVAZA) 1 g capsule Take by mouth.    Marland Kitchen omeprazole (PRILOSEC) 20 MG capsule     . predniSONE (DELTASONE) 10 MG tablet Take by mouth.    . VESICARE 5 MG tablet     . aspirin 81 MG tablet Take 81 mg by mouth daily.    . magnesium 30 MG tablet Take 30 mg by mouth 2 (two) times daily.    . Omega-3 Fatty Acids (FISH OIL) 1000 MG CAPS Take by mouth.    . ondansetron (ZOFRAN) 4 MG tablet Take 1 tablet (4 mg total) by mouth every 8 (eight) hours as needed for nausea or vomiting. 8 tablet 0   No facility-administered medications prior to visit.      ROS:  Review of Systems  Constitutional: Negative for fatigue, fever and unexpected weight change.  Respiratory: Negative for cough, shortness of breath and wheezing.   Cardiovascular: Negative for chest pain, palpitations and leg swelling.  Gastrointestinal: Negative for blood in stool, constipation, diarrhea, nausea and vomiting.  Endocrine: Negative for cold intolerance, heat intolerance and polyuria.  Genitourinary: Positive for genital sores. Negative for dyspareunia, dysuria, flank pain, frequency, hematuria, menstrual problem, pelvic pain, urgency, vaginal bleeding, vaginal discharge and vaginal pain.  Musculoskeletal: Negative for back pain, joint swelling and myalgias.  Skin: Negative for rash.  Neurological: Negative for dizziness, syncope, light-headedness, numbness and headaches.  Hematological: Negative for adenopathy.  Psychiatric/Behavioral: Negative for agitation, confusion, sleep disturbance and suicidal ideas. The patient is not nervous/anxious.    BREAST: No  symptoms    Objective: BP (!) 144/70   Pulse (!) 52   Ht 5\' 4"  (1.626 m)   Wt 158 lb (71.7 kg)   BMI 27.12 kg/m    Physical Exam  Constitutional: She is oriented to person, place, and time. She appears well-developed and well-nourished.  Genitourinary: Vagina normal. There is no rash or tenderness on the right labia. There is no rash or tenderness on  the left labia. No erythema or tenderness in the vagina. No vaginal discharge found. Right adnexum does not display mass and does not display tenderness. Left adnexum does not display mass and does not display tenderness. Rectal exam shows external hemorrhoid. Rectal exam shows no fissure and no tenderness.  Genitourinary Comments: UTERUS/CX SURG REM; FISSURES POST FOURCHETTE/PERINEAL AND PERIANAL AREA; PALE, HYPERTROPHIC SKIN PERINEAL AREA; C/W LS; ALSO MILD TO MOD VAG ATROPHY  Neck: Normal range of motion. No thyromegaly present.  Cardiovascular: Normal rate, regular rhythm and normal heart sounds.  No murmur heard. Pulmonary/Chest: Effort normal and breath sounds normal. Right breast exhibits no mass, no nipple discharge, no skin change and no tenderness. Left breast exhibits no mass, no nipple discharge, no skin change and no tenderness.  Abdominal: Soft. There is no tenderness. There is no guarding.  Musculoskeletal: Normal range of motion.  Neurological: She is alert and oriented to person, place, and time. No cranial nerve deficit.  Psychiatric: She has a normal mood and affect. Her behavior is normal.  Vitals reviewed.  Assessment/Plan:  Encounter for annual routine gynecological examination  Cervical cancer screening - Plan: Cytology - PAP  Screening for breast cancer - Pt current on mammo  Lichen sclerosus - Rx RF clobetasol crm. F/u prn.  - Plan: clobetasol ointment (TEMOVATE) 0.05 %  External hemorrhoid - Rectal sx most likely LS currently. Pt to treat with clobetasol to see if sx improve. Pt to request Rx RF of  hemorrhoid crm from pharmacy for RF.  Postmenopausal atrophic vaginitis - Has estrace Rx. Try coconut oil. F/u prn.    Meds ordered this encounter  Medications  . clobetasol ointment (TEMOVATE) 0.05 %    Sig: Apply to affected area every night for 4 weeks, then every other day for 4 weeks and then twice a week for maintenance    Dispense:  30 g    Refill:  5    Order Specific Question:   Supervising Provider    Answer:   Nadara Mustard [[id number]]            GYN counsel breast self exam, mammography screening, menopause, adequate intake of calcium and vitamin D, diet and exercise    F/U  Return in about 2 years (around 01/13/2020).  Alicia B. Copland, PA-C 01/12/2018 11:29 AM

## 2018-01-12 NOTE — Patient Instructions (Signed)
I value your feedback and entrusting us with your care. If you get a  patient survey, I would appreciate you taking the time to let us know about your experience today. Thank you! 

## 2018-01-16 LAB — CYTOLOGY - PAP: Diagnosis: NEGATIVE

## 2018-07-07 ENCOUNTER — Ambulatory Visit: Admission: RE | Admit: 2018-07-07 | Payer: Medicare Other | Source: Home / Self Care | Admitting: Gastroenterology

## 2018-07-07 ENCOUNTER — Encounter: Admission: RE | Payer: Self-pay | Source: Home / Self Care

## 2018-07-07 SURGERY — COLONOSCOPY WITH PROPOFOL
Anesthesia: General

## 2019-01-25 ENCOUNTER — Other Ambulatory Visit: Payer: Self-pay | Admitting: Family Medicine

## 2019-01-25 DIAGNOSIS — Z1231 Encounter for screening mammogram for malignant neoplasm of breast: Secondary | ICD-10-CM

## 2019-05-09 ENCOUNTER — Ambulatory Visit
Admission: RE | Admit: 2019-05-09 | Discharge: 2019-05-09 | Disposition: A | Discharge status: Home / Self Care | Payer: Medicare PPO | Source: Ambulatory Visit | Attending: Family Medicine | Admitting: Family Medicine

## 2019-05-09 DIAGNOSIS — Z1231 Encounter for screening mammogram for malignant neoplasm of breast: Secondary | ICD-10-CM | POA: Diagnosis present

## 2019-10-17 ENCOUNTER — Other Ambulatory Visit
Admission: RE | Admit: 2019-10-17 | Discharge: 2019-10-17 | Disposition: A | Discharge status: Home / Self Care | Payer: Medicare PPO | Source: Ambulatory Visit | Attending: Gastroenterology | Admitting: Gastroenterology

## 2019-10-17 ENCOUNTER — Other Ambulatory Visit: Payer: Self-pay

## 2019-10-17 DIAGNOSIS — Z20822 Contact with and (suspected) exposure to covid-19: Secondary | ICD-10-CM | POA: Insufficient documentation

## 2019-10-17 DIAGNOSIS — Z01812 Encounter for preprocedural laboratory examination: Secondary | ICD-10-CM | POA: Diagnosis present

## 2019-10-17 LAB — SARS CORONAVIRUS 2 (TAT 6-24 HRS): SARS Coronavirus 2: NEGATIVE

## 2019-10-18 ENCOUNTER — Encounter: Payer: Self-pay | Admitting: *Deleted

## 2019-10-19 ENCOUNTER — Encounter
Admission: RE | Disposition: A | Discharge status: Home / Self Care | Payer: Self-pay | Source: Home / Self Care | Attending: Gastroenterology

## 2019-10-19 ENCOUNTER — Ambulatory Visit: Payer: Medicare PPO | Admitting: Anesthesiology

## 2019-10-19 ENCOUNTER — Ambulatory Visit
Admission: RE | Admit: 2019-10-19 | Discharge: 2019-10-19 | Disposition: A | Discharge status: Home / Self Care | Payer: Medicare PPO | Attending: Gastroenterology | Admitting: Gastroenterology

## 2019-10-19 ENCOUNTER — Other Ambulatory Visit: Payer: Self-pay

## 2019-10-19 ENCOUNTER — Encounter: Payer: Self-pay | Admitting: *Deleted

## 2019-10-19 DIAGNOSIS — Z791 Long term (current) use of non-steroidal anti-inflammatories (NSAID): Secondary | ICD-10-CM | POA: Diagnosis not present

## 2019-10-19 DIAGNOSIS — Z79899 Other long term (current) drug therapy: Secondary | ICD-10-CM | POA: Diagnosis not present

## 2019-10-19 DIAGNOSIS — I10 Essential (primary) hypertension: Secondary | ICD-10-CM | POA: Insufficient documentation

## 2019-10-19 DIAGNOSIS — Z79890 Hormone replacement therapy (postmenopausal): Secondary | ICD-10-CM | POA: Insufficient documentation

## 2019-10-19 DIAGNOSIS — Z1211 Encounter for screening for malignant neoplasm of colon: Secondary | ICD-10-CM | POA: Diagnosis present

## 2019-10-19 DIAGNOSIS — Z86010 Personal history of colonic polyps: Secondary | ICD-10-CM | POA: Insufficient documentation

## 2019-10-19 DIAGNOSIS — F419 Anxiety disorder, unspecified: Secondary | ICD-10-CM | POA: Diagnosis not present

## 2019-10-19 DIAGNOSIS — F329 Major depressive disorder, single episode, unspecified: Secondary | ICD-10-CM | POA: Insufficient documentation

## 2019-10-19 DIAGNOSIS — Q600 Renal agenesis, unilateral: Secondary | ICD-10-CM | POA: Insufficient documentation

## 2019-10-19 HISTORY — PX: COLONOSCOPY WITH PROPOFOL: SHX5780

## 2019-10-19 SURGERY — COLONOSCOPY WITH PROPOFOL
Anesthesia: General

## 2019-10-19 MED ORDER — FENTANYL CITRATE (PF) 100 MCG/2ML IJ SOLN
INTRAMUSCULAR | Status: DC | PRN
  Administered 2019-10-19 (×2): 25 ug via INTRAVENOUS
  Administered 2019-10-19: 50 ug via INTRAVENOUS

## 2019-10-19 MED ORDER — LIDOCAINE HCL (PF) 2 % IJ SOLN
INTRAMUSCULAR | Status: DC | PRN
  Administered 2019-10-19: 60 mg

## 2019-10-19 MED ORDER — MIDAZOLAM HCL 2 MG/2ML IJ SOLN
INTRAMUSCULAR | Status: AC
  Filled 2019-10-19: qty 2

## 2019-10-19 MED ORDER — FENTANYL CITRATE (PF) 100 MCG/2ML IJ SOLN
INTRAMUSCULAR | Status: AC
  Filled 2019-10-19: qty 2

## 2019-10-19 MED ORDER — HYDRALAZINE HCL 20 MG/ML IJ SOLN
INTRAMUSCULAR | Status: DC | PRN
  Administered 2019-10-19: 5 mg via INTRAVENOUS

## 2019-10-19 MED ORDER — LIDOCAINE HCL (PF) 2 % IJ SOLN
INTRAMUSCULAR | Status: AC
  Filled 2019-10-19: qty 5

## 2019-10-19 MED ORDER — MIDAZOLAM HCL 5 MG/5ML IJ SOLN
INTRAMUSCULAR | Status: DC | PRN
  Administered 2019-10-19: 2 mg via INTRAVENOUS

## 2019-10-19 MED ORDER — HYDRALAZINE HCL 20 MG/ML IJ SOLN
INTRAMUSCULAR | Status: AC
  Filled 2019-10-19: qty 1

## 2019-10-19 MED ORDER — PROPOFOL 10 MG/ML IV BOLUS
INTRAVENOUS | Status: DC | PRN
  Administered 2019-10-19: 30 mg via INTRAVENOUS

## 2019-10-19 MED ORDER — SODIUM CHLORIDE 0.9 % IV SOLN: INTRAVENOUS | Status: DC

## 2019-10-19 MED ORDER — PROPOFOL 500 MG/50ML IV EMUL
INTRAVENOUS | Status: DC | PRN
  Administered 2019-10-19: 50 ug/kg/min via INTRAVENOUS

## 2019-10-19 NOTE — Transfer of Care (Signed)
Immediate Anesthesia Transfer of Care Note  Patient: Patricia Cowan  Procedure(s) Performed: COLONOSCOPY WITH PROPOFOL (N/A )  Patient Location: PACU  Anesthesia Type:General  Level of Consciousness: awake  Airway & Oxygen Therapy: Patient Spontanous Breathing and Patient connected to nasal cannula oxygen  Post-op Assessment: Report given to RN and Post -op Vital signs reviewed and stable  Post vital signs: Reviewed and stable  Last Vitals:  Vitals Value Taken Time  BP 129/64 10/19/19 1034  Temp    Pulse 70 10/19/19 1034  Resp 20 10/19/19 1034  SpO2 100 % 10/19/19 1034  Vitals shown include unvalidated device data.  Last Pain:  Vitals:   10/19/19 0936  TempSrc: Temporal  PainSc: 0-No pain         Complications: No complications documented.

## 2019-10-19 NOTE — Interval H&P Note (Signed)
History and Physical Interval Note:  10/19/2019 10:03 AM  Patricia Cowan  has presented today for surgery, with the diagnosis of PERSONAL HX.OF COLON POLYPS.  The various methods of treatment have been discussed with the patient and family. After consideration of risks, benefits and other options for treatment, the patient has consented to  Procedure(s): COLONOSCOPY WITH PROPOFOL (N/A) as a surgical intervention.  The patient's history has been reviewed, patient examined, no change in status, stable for surgery.  I have reviewed the patient's chart and labs.  Questions were answered to the patient's satisfaction.     Regis Bill  Ok to proceed with colonoscopy

## 2019-10-19 NOTE — Anesthesia Preprocedure Evaluation (Addendum)
Anesthesia Evaluation  Patient identified by MRN, date of birth, ID band Patient awake    Reviewed: Allergy & Precautions, H&P , NPO status , Patient's Chart, lab work & pertinent test results  Airway Mallampati: II  TM Distance: >3 FB     Dental  (+) Teeth Intact   Pulmonary neg pulmonary ROS, neg shortness of breath, neg COPD, Not current smoker,    breath sounds clear to auscultation       Cardiovascular hypertension, (-) angina(-) Past MI (-) dysrhythmias  Rhythm:regular Rate:Normal     Neuro/Psych PSYCHIATRIC DISORDERS Anxiety Depression negative neurological ROS     GI/Hepatic negative GI ROS, Neg liver ROS,   Endo/Other  negative endocrine ROS  Renal/GU Renal diseaseCongenital absence of one kidney  negative genitourinary   Musculoskeletal   Abdominal   Peds  Hematology negative hematology ROS (+)   Anesthesia Other Findings Past Medical History: No date: Anxiety No date: Congenital single kidney No date: Dysthymia No date: Dysthymia No date: Hemorrhoid No date: HTN (hypertension) No date: Lichen sclerosus  Past Surgical History: No date: ABDOMINAL HYSTERECTOMY 2012: BREAST CYST ASPIRATION; Left     Comment:  neg (performed 2 times) No date: NASAL POLYP SURGERY     Reproductive/Obstetrics negative OB ROS                            Anesthesia Physical Anesthesia Plan  ASA: II  Anesthesia Plan: General   Post-op Pain Management:    Induction:   PONV Risk Score and Plan: Propofol infusion and TIVA  Airway Management Planned: Nasal Cannula  Additional Equipment:   Intra-op Plan:   Post-operative Plan:   Informed Consent: I have reviewed the patients History and Physical, chart, labs and discussed the procedure including the risks, benefits and alternatives for the proposed anesthesia with the patient or authorized representative who has indicated his/her  understanding and acceptance.     Dental Advisory Given  Plan Discussed with: Anesthesiologist, CRNA and Surgeon  Anesthesia Plan Comments:         Anesthesia Quick Evaluation

## 2019-10-19 NOTE — H&P (Signed)
Outpatient short stay form Pre-procedure 10/19/2019 10:01 AM Merlyn Lot MD, MPH  Primary Physician: Dr. Burnett Sheng  Reason for visit:  Surveillance Colon  History of present illness:   69 y/o lady with history of one < 1cm TA in 2015 here for surveillance colonoscopy. No blood thinners, no family history of GI malignancies, no abdominal surgeries.    Current Facility-Administered Medications:  .  0.9 %  sodium chloride infusion, , Intravenous, Continuous, Rama Mcclintock, Rossie Muskrat, MD, Last Rate: 20 mL/hr at 10/19/19 0945, New Bag at 10/19/19 0945  Medications Prior to Admission  Medication Sig Dispense Refill Last Dose  . Aspirin-Calcium Carbonate 81-777 MG TABS Take by mouth.   Past Month at Unknown time  . calcium-vitamin D 250-100 MG-UNIT tablet Take 1 tablet by mouth 2 (two) times daily.   Past Month at Unknown time  . clobetasol ointment (TEMOVATE) 0.05 % Apply to affected area every night for 4 weeks, then every other day for 4 weeks and then twice a week for maintenance 30 g 5   . escitalopram (LEXAPRO) 20 MG tablet    10/18/2019 at Unknown time  . estradiol (ESTRACE) 0.1 MG/GM vaginal cream Place vaginally.     Marland Kitchen glucosamine-chondroitin 500-400 MG tablet Take 1 tablet by mouth 3 (three) times daily.   10/18/2019 at Unknown time  . lisinopril (PRINIVIL,ZESTRIL) 10 MG tablet    10/18/2019 at Unknown time  . meloxicam (MOBIC) 15 MG tablet Take 15 mg by mouth daily.   10/18/2019 at Unknown time  . Methylsulfonylmethane (MSM) 750 MG CAPS Take by mouth.   10/18/2019 at Unknown time  . omega-3 acid ethyl esters (LOVAZA) 1 g capsule Take by mouth.   10/18/2019 at Unknown time  . omeprazole (PRILOSEC) 20 MG capsule    10/18/2019 at Unknown time  . VESICARE 5 MG tablet    10/18/2019 at Unknown time     Allergies  Allergen Reactions  . Codeine Hives  . Penicillins Rash     Past Medical History:  Diagnosis Date  . Anxiety   . Congenital single kidney   . Dysthymia   . Dysthymia   .  Hemorrhoid   . HTN (hypertension)   . Lichen sclerosus     Review of systems:  Otherwise negative.    Physical Exam  Gen: Alert, oriented. Appears stated age.  HEENT: [place]/AT. PERRLA. Lungs:No respiratory distress Abd: soft, benign, no masses. BS+ Ext: No edema. Pulses 2+    Planned procedures: Proceed with colonoscopy. The patient understands the nature of the planned procedure, indications, risks, alternatives and potential complications including but not limited to bleeding, infection, perforation, damage to internal organs and possible oversedation/side effects from anesthesia. The patient agrees and gives consent to proceed.  Please refer to procedure notes for findings, recommendations and patient disposition/instructions.     Merlyn Lot MD, MPH Gastroenterology 10/19/2019  10:01 AM

## 2019-10-19 NOTE — Op Note (Signed)
[facility] Gastroenterology Patient Name: Patricia Cowan Procedure Date: 10/19/2019 9:58 AM MRN: [id number] Account #: [id number] Date of Birth: 1950/10/11 Admit Type: Outpatient Age: 69 Room: [facility] ENDO ROOM 3 Gender: Female Note Status: Finalized Procedure:             Colonoscopy Indications:           High risk colon cancer surveillance: Personal history                         of colonic polyps Providers:             Andrey Farmer MD, MD Referring MD:          Irven Easterly. Kary Kos, MD (Referring MD) Medicines:             Monitored Anesthesia Care Complications:         No immediate complications. Procedure:             Pre-Anesthesia Assessment:                        - Prior to the procedure, a History and Physical was                         performed, and patient medications and allergies were                         reviewed. The patient is competent. The risks and                         benefits of the procedure and the sedation options and                         risks were discussed with the patient. All questions                         were answered and informed consent was obtained.                         Patient identification and proposed procedure were                         verified by the physician, the nurse, the anesthetist                         and the technician in the endoscopy suite. Mental                         Status Examination: alert and oriented. Airway                         Examination: normal oropharyngeal airway and neck                         mobility. Respiratory Examination: clear to                         auscultation. CV Examination: normal. Prophylactic  Antibiotics: The patient does not require prophylactic                         antibiotics. Prior Anticoagulants: The patient has                         taken no previous anticoagulant or antiplatelet                         agents. ASA  Grade Assessment: II - A patient with mild                         systemic disease. After reviewing the risks and                         benefits, the patient was deemed in satisfactory                         condition to undergo the procedure. The anesthesia                         plan was to use monitored anesthesia care (MAC).                         Immediately prior to administration of medications,                         the patient was re-assessed for adequacy to receive                         sedatives. The heart rate, respiratory rate, oxygen                         saturations, blood pressure, adequacy of pulmonary                         ventilation, and response to care were monitored                         throughout the procedure. The physical status of the                         patient was re-assessed after the procedure.                        After obtaining informed consent, the colonoscope was                         passed under direct vision. Throughout the procedure,                         the patient's blood pressure, pulse, and oxygen                         saturations were monitored continuously. The                         Colonoscope was introduced through the anus and  advanced to the the cecum, identified by appendiceal                         orifice and ileocecal valve. The colonoscopy was                         performed without difficulty. The patient tolerated                         the procedure well. The quality of the bowel                         preparation was excellent. Findings:      The perianal and digital rectal examinations were normal.      The colon (entire examined portion) appeared normal.      Retroflexion in the rectum was not performed due to small rectal vault.       Close inspection made on withdrawal and no abnormalities noted. Impression:            - The entire examined colon is normal.                         - No specimens collected. Recommendation:        - Discharge patient to home.                        - Resume previous diet.                        - Continue present medications.                        - Repeat colonoscopy in 10 years for surveillance.                        - Return to referring physician as previously                         scheduled. Procedure Code(s):     --- Professional ---                        N0539, Colorectal cancer screening; colonoscopy on                         individual at high risk Diagnosis Code(s):     --- Professional ---                        Z86.010, Personal history of colonic polyps CPT copyright 2019 [employer]. All rights reserved. The codes documented in this report are preliminary and upon coder review may  be revised to meet current compliance requirements. Andrey Farmer, MD Andrey Farmer MD, MD 10/19/2019 10:34:39 AM Number of Addenda: 0 Note Initiated On: 10/19/2019 9:58 AM Scope Withdrawal Time: 0 hours 11 minutes 0 seconds  Total Procedure Duration: 0 hours 20 minutes 39 seconds  Estimated Blood Loss:  Estimated blood loss: none.      [facility]

## 2019-10-20 NOTE — Anesthesia Postprocedure Evaluation (Signed)
Anesthesia Post Note  Patient: Patricia Cowan  Procedure(s) Performed: COLONOSCOPY WITH PROPOFOL (N/A )  Patient location during evaluation: PACU Anesthesia Type: General Level of consciousness: awake and alert Pain management: pain level controlled Vital Signs Assessment: post-procedure vital signs reviewed and stable Respiratory status: spontaneous breathing, nonlabored ventilation and respiratory function stable Cardiovascular status: blood pressure returned to baseline and stable Postop Assessment: no apparent nausea or vomiting Anesthetic complications: no   No complications documented.   Last Vitals:  Vitals:   10/19/19 1030 10/19/19 1040  BP: (!) 129/64 (!) 138/76  Pulse: 64 61  Resp: 18 20  Temp: 36.4 C   SpO2: 100% 100%    Last Pain:  Vitals:   10/19/19 1030  TempSrc: Temporal  PainSc:                  Karleen Hampshire

## 2020-01-15 ENCOUNTER — Ambulatory Visit: Payer: Medicare Other | Admitting: Obstetrics and Gynecology

## 2020-01-17 ENCOUNTER — Ambulatory Visit: Payer: Medicare Other | Admitting: Obstetrics and Gynecology

## 2020-03-05 DIAGNOSIS — L900 Lichen sclerosus et atrophicus: Secondary | ICD-10-CM | POA: Insufficient documentation

## 2020-03-05 NOTE — Progress Notes (Signed)
PCP: Patricia Mina, MD   Chief Complaint  Patient presents with  . Gynecologic Exam    No concerns    HPI:      [name] is a 69 y.o. No obstetric history on file. who LMP was No LMP recorded. Patient has had a hysterectomy., presents today for her Medicare annual examination.  Her menses are absent due to Patricia Cowan for leio. She does not have PMB. Has infrequent vasomotor sx.   Sex activity: not sex acti - status post hysterectomy and post menopausal status. She does have vaginal dryness. Has used estrace crm in past but not anymore. Husband with health issues. Also has LS, diagnosed on bx. No recent sx, no recent tx.   Last Pap: 01/12/18 Results were normal; no longer indicated since s/p TAH Hx of STDs: none  Last mammogram:  05/09/19  Results were: normal--routine follow-up in 12 months There is a FH of breast cancer in her pat aunt and pat cousin; genetic testing not indicated. There is no FH of ovarian cancer. The patient does do self-breast exams.  Colonoscopy: 7/21 at [facility]; sched by PCP.  Repeat due after 10 years. Hx of hemorrhoids.   Tobacco use: The patient denies current or previous tobacco use. Alcohol use: none  No drug use Exercise: very active  She does get adequate calcium but not Vitamin D in her diet.  Labs with PCP.   Past Medical History:  Diagnosis Date  . Anxiety   . Congenital single kidney   . Dysthymia   . Dysthymia   . Hemorrhoid   . HTN (hypertension)   . Lichen sclerosus     Past Surgical History:  Procedure Laterality Date  . ABDOMINAL HYSTERECTOMY    . BREAST CYST ASPIRATION Left 2012   neg (performed 2 times)  . COLONOSCOPY WITH PROPOFOL N/A 10/19/2019   Procedure: COLONOSCOPY WITH PROPOFOL;  Surgeon: Patricia Bill, MD;  Location: [facility] ENDOSCOPY;  Service: Endoscopy;  Laterality: N/A;  . NASAL POLYP SURGERY      Family History  Problem Relation Age of Onset  . Breast cancer Cousin 35        Malignant  . Depression Mother   . Depression Sister   . ADD / ADHD Brother   . Breast cancer Paternal Aunt 37       Malignant    Social History   Socioeconomic History  . Marital status: Married    Spouse name: Not on file  . Number of children: Not on file  . Years of education: Not on file  . Highest education level: Not on file  Occupational History  . Not on file  Tobacco Use  . Smoking status: Never Smoker  . Smokeless tobacco: Never Used  Vaping Use  . Vaping Use: Never used  Substance and Sexual Activity  . Alcohol use: Yes    Alcohol/week: 1.0 - 3.0 standard drink    Types: 1 - 2 Cans of beer per week    Comment: socially  . Drug use: No  . Sexual activity: Not Currently    Birth control/protection: Surgical    Comment: Hysterectomy  Other Topics Concern  . Not on file  Social History Narrative  . Not on file   Social Determinants of Health   Financial Resource Strain: Not on file  Food Insecurity: Not on file  Transportation Needs: Not on file  Physical Activity: Not on file  Stress: Not on file  Social Connections:  Not on file  Intimate Partner Violence: Not on file    Outpatient Medications Prior to Visit  Medication Sig Dispense Refill  . Aspirin-Calcium Carbonate 81-777 MG TABS Take by mouth.    Marland Kitchen buPROPion (WELLBUTRIN XL) 150 MG 24 hr tablet Take 1 tablet by mouth daily.    . clobetasol ointment (TEMOVATE) 0.05 % Apply to affected area every night for 4 weeks, then every other day for 4 weeks and then twice a week for maintenance 30 g 5  . escitalopram (LEXAPRO) 20 MG tablet     . estradiol (ESTRACE) 0.1 MG/GM vaginal cream Place vaginally.    Marland Kitchen glucosamine-chondroitin 500-400 MG tablet Take 1 tablet by mouth 3 (three) times daily.    Marland Kitchen lisinopril (PRINIVIL,ZESTRIL) 10 MG tablet     . meloxicam (MOBIC) 15 MG tablet Take 15 mg by mouth daily.    . Methylsulfonylmethane (MSM) 750 MG CAPS Take by mouth.    Marland Kitchen omeprazole (PRILOSEC) 20 MG capsule      . VESICARE 5 MG tablet     . calcium-vitamin D 250-100 MG-UNIT tablet Take 1 tablet by mouth 2 (two) times daily.    Marland Kitchen omega-3 acid ethyl esters (LOVAZA) 1 g capsule Take by mouth.     No facility-administered medications prior to visit.     ROS:  Review of Systems  Constitutional: Negative for fatigue, fever and unexpected weight change.  Respiratory: Negative for cough, shortness of breath and wheezing.   Cardiovascular: Negative for chest pain, palpitations and leg swelling.  Gastrointestinal: Negative for blood in stool, constipation, diarrhea, nausea and vomiting.  Endocrine: Negative for cold intolerance, heat intolerance and polyuria.  Genitourinary: Negative for dyspareunia, dysuria, flank pain, frequency, genital sores, hematuria, menstrual problem, pelvic pain, urgency, vaginal bleeding, vaginal discharge and vaginal pain.  Musculoskeletal: Negative for back pain, joint swelling and myalgias.  Skin: Negative for rash.  Neurological: Negative for dizziness, syncope, light-headedness, numbness and headaches.  Hematological: Negative for adenopathy.  Psychiatric/Behavioral: Negative for agitation, confusion, sleep disturbance and suicidal ideas. The patient is not nervous/anxious.   BREAST: No symptoms    Objective: BP 130/70   Ht 5\' 4"  (1.626 m)   Wt 164 lb (74.4 kg)   BMI 28.15 kg/m    Physical Exam Constitutional:      Appearance: She is well-developed.  Genitourinary:     Vulva normal.     Genitourinary Comments: UTERUS/CX SURG REM; FISSURES POST FOURCHETTE/PERINEAL AND PERIANAL AREA; PALE, HYPERTROPHIC SKIN PERINEAL AREA; C/W LS; ALSO MILD TO MOD VAG ATROPHY     Right Labia: No rash, tenderness or lesions.    Left Labia: No tenderness, lesions or rash.       Vaginal cuff intact.    No vaginal discharge, erythema or tenderness.      Right Adnexa: absent.    Right Adnexa: not tender and no mass present.    Left Adnexa: absent.    Left Adnexa: not  tender and no mass present.    Cervix is absent.     Uterus is absent.  Rectum:     External hemorrhoid present.     No anal fissure or tenderness.  Breasts:     Right: No mass, nipple discharge, skin change or tenderness.     Left: No mass, nipple discharge, skin change or tenderness.    Neck:     Thyroid: No thyromegaly.  Cardiovascular:     Rate and Rhythm: Normal rate and regular rhythm.  Heart sounds: Normal heart sounds. No murmur heard.   Pulmonary:     Effort: Pulmonary effort is normal.     Breath sounds: Normal breath sounds.  Abdominal:     Palpations: Abdomen is soft.     Tenderness: There is no abdominal tenderness. There is no guarding or rebound.  Musculoskeletal:        General: Normal range of motion.     Cervical back: Normal range of motion.  Lymphadenopathy:     Cervical: No cervical adenopathy.  Neurological:     General: No focal deficit present.     Mental Status: She is alert and oriented to person, place, and time.     Cranial Nerves: No cranial nerve deficit.  Skin:    General: Skin is warm and dry.  Psychiatric:        Mood and Affect: Mood normal.        Behavior: Behavior normal.        Thought Content: Thought content normal.        Judgment: Judgment normal.  Vitals reviewed.    Assessment/Plan:  Encounter for annual routine gynecological examination  Encounter for screening mammogram for malignant neoplasm of breast - Plan: MM 3D SCREEN BREAST BILATERAL; pt to sched mammo for 2/22  Lichen sclerosus--small amt on exam. Restart clobetasol for 1 wk. F/u prn. Can treat prn but aware of slight increase risk of skin cancer and needs Q2 yr eval.          GYN counsel breast self exam, mammography screening, menopause, adequate intake of calcium and vitamin D, diet and exercise    F/U  Return in about 2 years (around 03/06/2022).  Celestial Barnfield B. Berthel Bagnall, PA-C 03/06/2020 11:06 AM

## 2020-03-06 ENCOUNTER — Ambulatory Visit (INDEPENDENT_AMBULATORY_CARE_PROVIDER_SITE_OTHER): Payer: Medicare PPO | Admitting: Obstetrics and Gynecology

## 2020-03-06 ENCOUNTER — Encounter: Payer: Self-pay | Admitting: Obstetrics and Gynecology

## 2020-03-06 ENCOUNTER — Other Ambulatory Visit: Payer: Self-pay

## 2020-03-06 VITALS — BP 130/70 | Ht 64.0 in | Wt 164.0 lb

## 2020-03-06 DIAGNOSIS — Z1231 Encounter for screening mammogram for malignant neoplasm of breast: Secondary | ICD-10-CM

## 2020-03-06 DIAGNOSIS — L900 Lichen sclerosus et atrophicus: Secondary | ICD-10-CM

## 2020-03-06 DIAGNOSIS — Z01419 Encounter for gynecological examination (general) (routine) without abnormal findings: Secondary | ICD-10-CM | POA: Diagnosis not present

## 2020-03-06 NOTE — Patient Instructions (Addendum)
I value your feedback and entrusting us with your care. If you get a [facility] patient survey, I would appreciate you taking the time to let us know about your experience today. Thank you! ° °As of March 01, 2019, your lab results will be released to your MyChart immediately, before I even have a chance to see them. Please give me time to review them and contact you if there are any abnormalities. Thank you for your patience.  ° °[facility]: [phone] ° ° ° °

## 2020-04-09 ENCOUNTER — Other Ambulatory Visit: Payer: Self-pay | Admitting: Family Medicine

## 2020-04-09 DIAGNOSIS — M79652 Pain in left thigh: Secondary | ICD-10-CM

## 2020-04-09 DIAGNOSIS — M25562 Pain in left knee: Secondary | ICD-10-CM

## 2020-04-10 ENCOUNTER — Other Ambulatory Visit: Payer: Self-pay

## 2020-04-10 ENCOUNTER — Ambulatory Visit
Admission: RE | Admit: 2020-04-10 | Discharge: 2020-04-10 | Disposition: A | Discharge status: Home / Self Care | Payer: Medicare PPO | Source: Ambulatory Visit | Attending: Family Medicine | Admitting: Family Medicine

## 2020-04-10 DIAGNOSIS — M25562 Pain in left knee: Secondary | ICD-10-CM | POA: Insufficient documentation

## 2020-04-10 DIAGNOSIS — M79652 Pain in left thigh: Secondary | ICD-10-CM | POA: Diagnosis present

## 2020-05-21 ENCOUNTER — Other Ambulatory Visit: Payer: Self-pay | Admitting: Family Medicine

## 2020-05-21 DIAGNOSIS — Z1231 Encounter for screening mammogram for malignant neoplasm of breast: Secondary | ICD-10-CM

## 2020-06-25 ENCOUNTER — Other Ambulatory Visit: Payer: Self-pay

## 2020-06-25 ENCOUNTER — Ambulatory Visit
Admission: RE | Admit: 2020-06-25 | Discharge: 2020-06-25 | Disposition: A | Discharge status: Home / Self Care | Payer: Medicare PPO | Source: Ambulatory Visit | Attending: Family Medicine | Admitting: Family Medicine

## 2020-06-25 DIAGNOSIS — Z1231 Encounter for screening mammogram for malignant neoplasm of breast: Secondary | ICD-10-CM | POA: Diagnosis present

## 2020-10-10 ENCOUNTER — Other Ambulatory Visit: Payer: Self-pay | Admitting: Orthopedic Surgery

## 2020-10-10 ENCOUNTER — Other Ambulatory Visit (HOSPITAL_COMMUNITY): Payer: Self-pay | Admitting: Orthopedic Surgery

## 2020-10-10 DIAGNOSIS — M25462 Effusion, left knee: Secondary | ICD-10-CM

## 2020-10-10 DIAGNOSIS — M25562 Pain in left knee: Secondary | ICD-10-CM

## 2020-10-20 ENCOUNTER — Other Ambulatory Visit: Payer: Self-pay

## 2020-10-20 ENCOUNTER — Ambulatory Visit
Admission: RE | Admit: 2020-10-20 | Discharge: 2020-10-20 | Disposition: A | Discharge status: Home / Self Care | Payer: Medicare PPO | Source: Ambulatory Visit | Attending: Orthopedic Surgery | Admitting: Orthopedic Surgery

## 2020-10-20 DIAGNOSIS — M25562 Pain in left knee: Secondary | ICD-10-CM | POA: Insufficient documentation

## 2020-10-20 DIAGNOSIS — M25462 Effusion, left knee: Secondary | ICD-10-CM | POA: Diagnosis present

## 2021-04-25 IMAGING — US US EXTREM LOW VENOUS*L*
1 series · 13 of 24 positions shown · non-contrast
Comparison: None.

CLINICAL DATA: Left lower extremity pain.  Evaluate for DVT.



[Series 1: us extrem low venous*left* · 0.07mm/px · 13 of 27 slices shown]
[im 1/27]
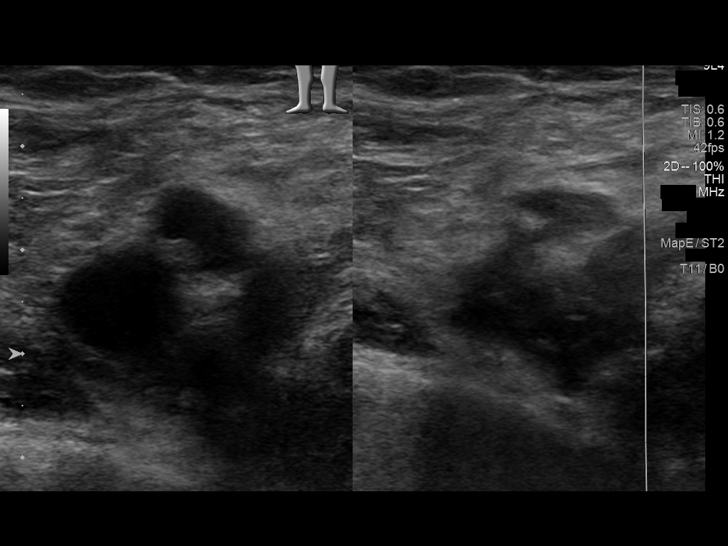
[im 3/27]
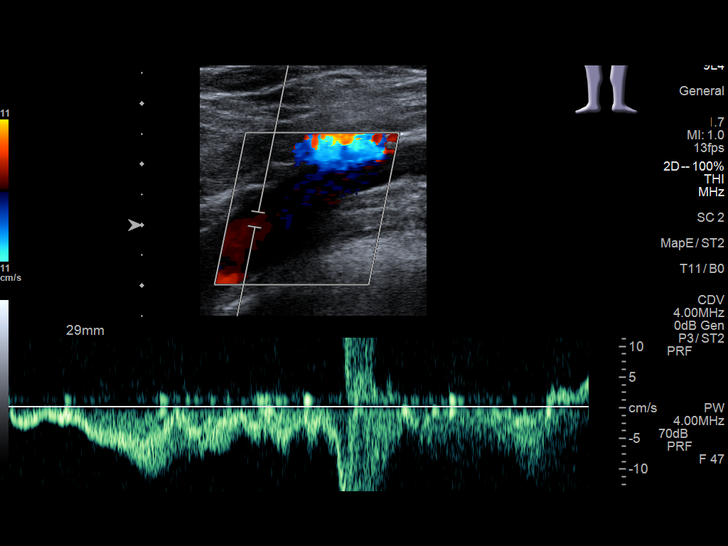
[im 5/27]
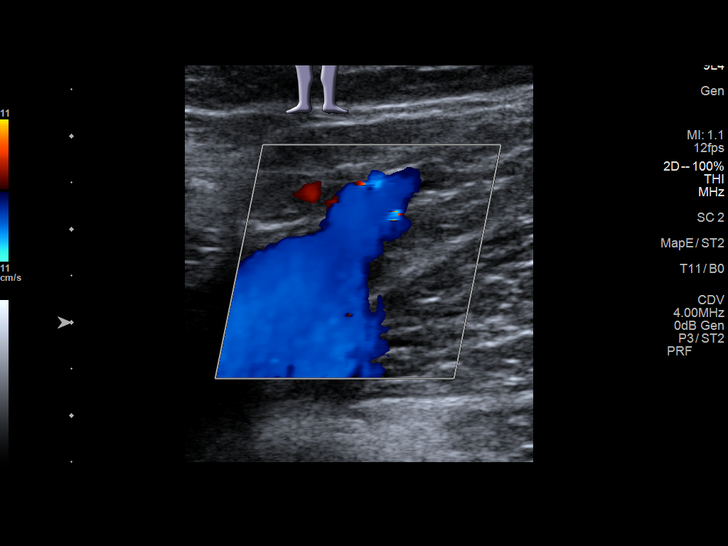
[im 7/27]
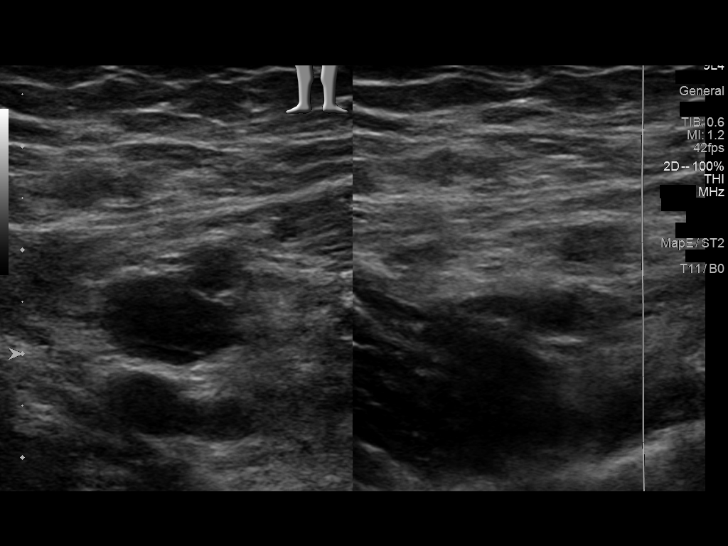
[im 10/27]
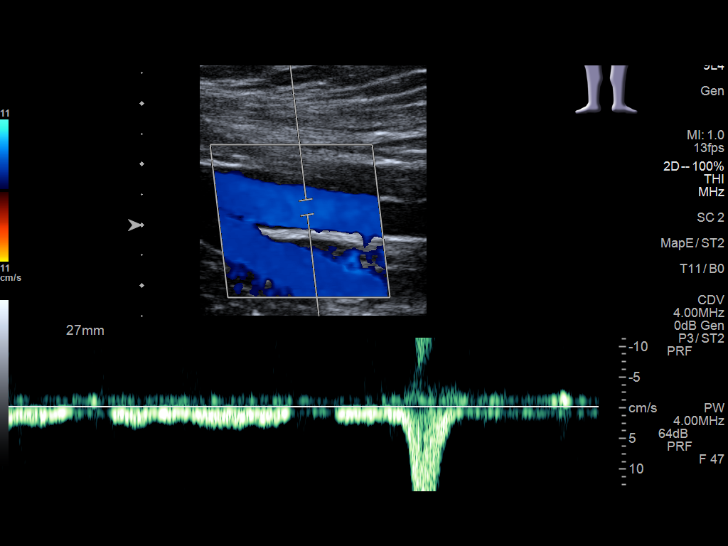
[im 12/27]
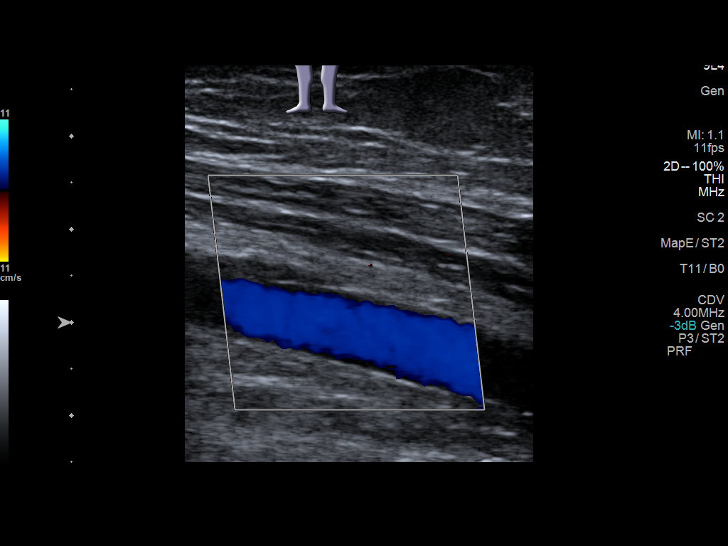
[im 14/27]
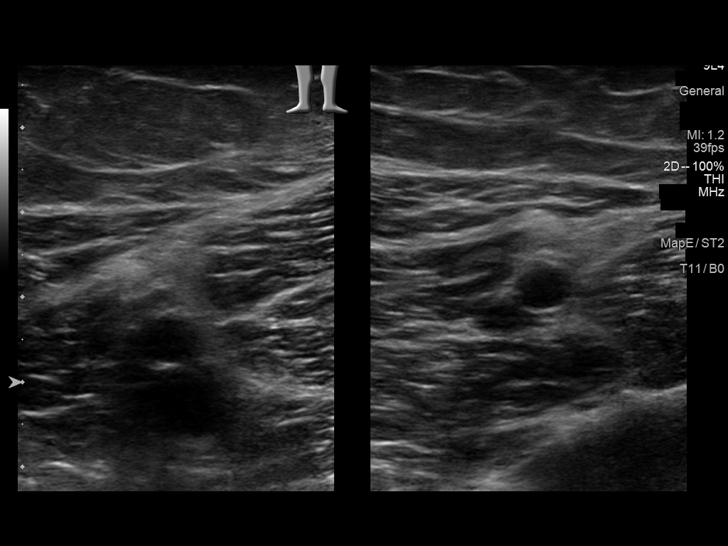
[im 15/27]
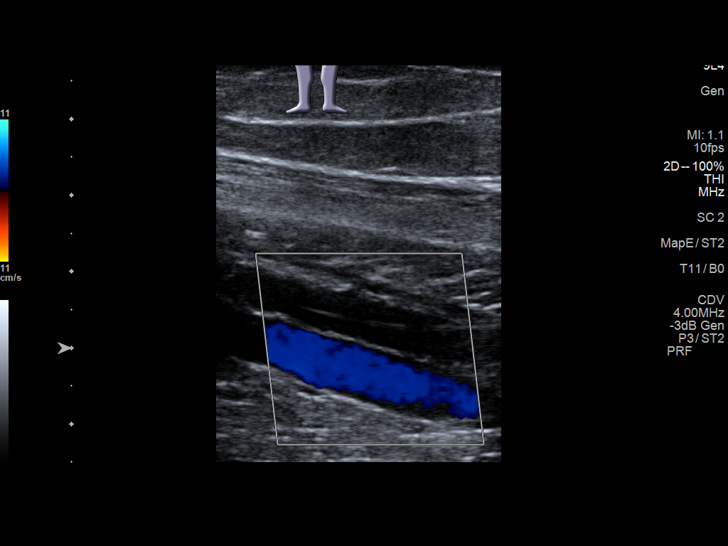
[im 17/27]
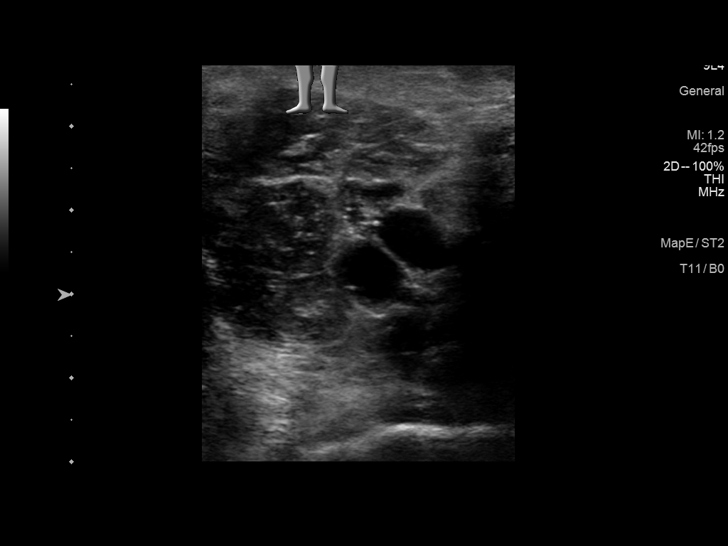
[im 20/27]
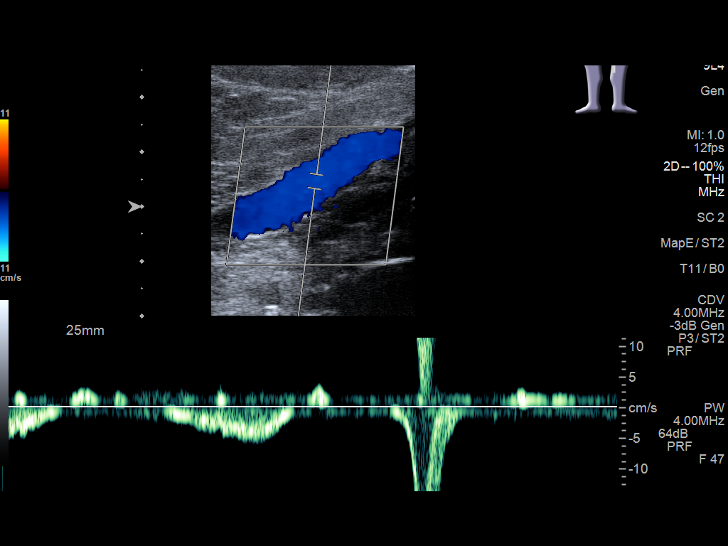
[im 22/27]
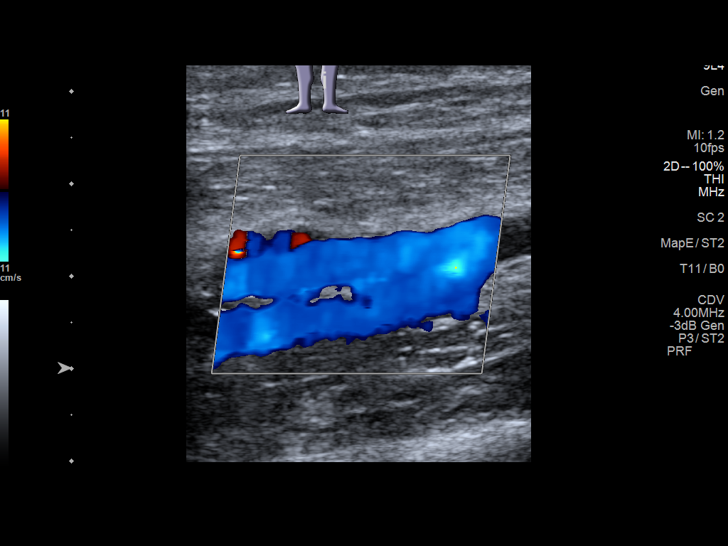
[im 24/27]
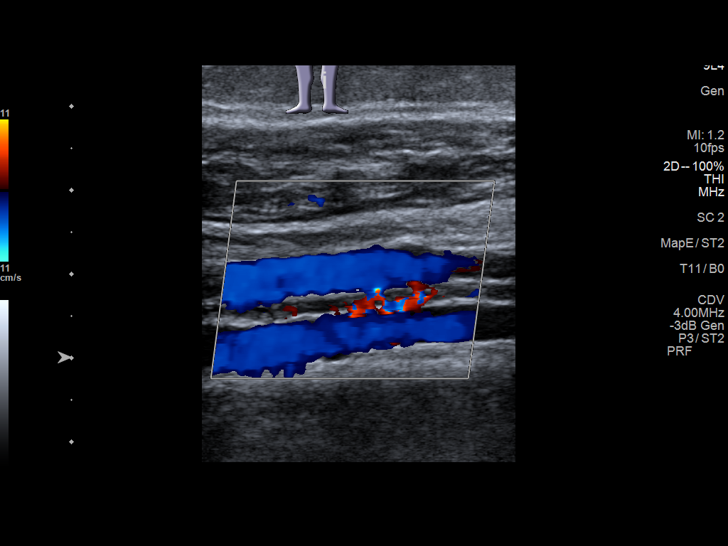
[im 27/27]
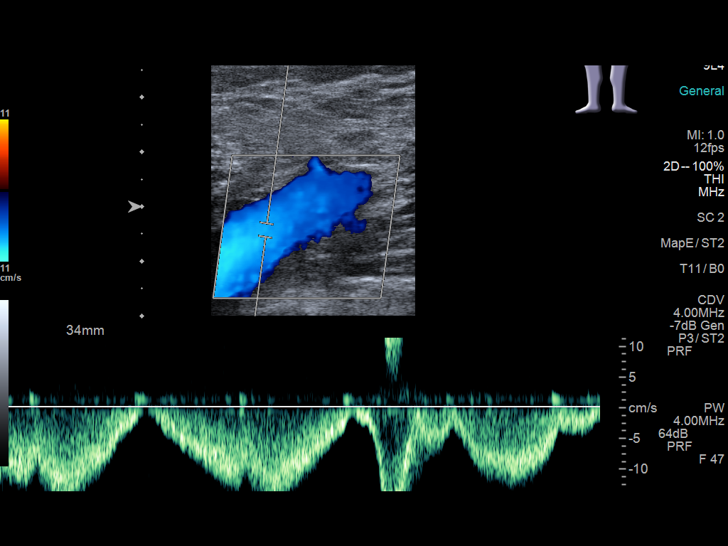

[13 of 24 positions shown; findings below may reference images not displayed]

FINDINGS: Contralateral Common Femoral Vein: Respiratory phasicity is normal
and symmetric with the symptomatic side. No evidence of thrombus.
Normal compressibility.

Common Femoral Vein: No evidence of thrombus. Normal
compressibility, respiratory phasicity and response to augmentation.

Saphenofemoral Junction: No evidence of thrombus. Normal
compressibility and flow on color Doppler imaging.

Profunda Femoral Vein: No evidence of thrombus. Normal
compressibility and flow on color Doppler imaging.

Femoral Vein: No evidence of thrombus. Normal compressibility,
respiratory phasicity and response to augmentation.

Popliteal Vein: No evidence of thrombus. Normal compressibility,
respiratory phasicity and response to augmentation.

Calf Veins: No evidence of thrombus. Normal compressibility and flow
on color Doppler imaging.

Superficial Great Saphenous Vein: No evidence of thrombus. Normal
compressibility.

Venous Reflux:  None.

Other Findings:  None.
IMPRESSION: No evidence of DVT within the left lower extremity.

## 2021-07-08 ENCOUNTER — Other Ambulatory Visit: Payer: Self-pay | Admitting: Family Medicine

## 2021-07-08 DIAGNOSIS — Z1231 Encounter for screening mammogram for malignant neoplasm of breast: Secondary | ICD-10-CM

## 2021-08-10 ENCOUNTER — Ambulatory Visit
Admission: RE | Admit: 2021-08-10 | Discharge: 2021-08-10 | Disposition: A | Discharge status: Home / Self Care | Payer: Medicare PPO | Source: Ambulatory Visit | Attending: Family Medicine | Admitting: Family Medicine

## 2021-08-10 DIAGNOSIS — Z1231 Encounter for screening mammogram for malignant neoplasm of breast: Secondary | ICD-10-CM | POA: Diagnosis present

## 2022-02-10 ENCOUNTER — Ambulatory Visit
Admission: RE | Admit: 2022-02-10 | Discharge: 2022-02-10 | Disposition: A | Discharge status: Home / Self Care | Payer: Medicare PPO | Source: Ambulatory Visit | Attending: Family Medicine | Admitting: Family Medicine

## 2022-02-10 ENCOUNTER — Other Ambulatory Visit: Payer: Self-pay | Admitting: Family Medicine

## 2022-02-10 DIAGNOSIS — I728 Aneurysm of other specified arteries: Secondary | ICD-10-CM

## 2022-02-10 LAB — POCT I-STAT CREATININE: Creatinine, Ser: 1 mg/dL (ref 0.44–1.00)

## 2022-02-10 MED ORDER — IOHEXOL 350 MG/ML SOLN
75.0000 mL | Freq: Once | INTRAVENOUS | Status: AC | PRN
  Administered 2022-02-10: 75 mL via INTRAVENOUS

## 2022-04-02 DIAGNOSIS — K219 Gastro-esophageal reflux disease without esophagitis: Secondary | ICD-10-CM | POA: Insufficient documentation

## 2022-04-02 DIAGNOSIS — I722 Aneurysm of renal artery: Secondary | ICD-10-CM | POA: Insufficient documentation

## 2022-04-02 DIAGNOSIS — I701 Atherosclerosis of renal artery: Secondary | ICD-10-CM | POA: Insufficient documentation

## 2022-04-02 NOTE — Progress Notes (Unsigned)
MRN : [id number]  Patricia Cowan is a 72 y.o. ([birth year]) female who presents with chief complaint of check circulation.  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 The patient has a long history of hypertension which recently has become increasingly difficult to control utilizing medical therapy. The patient has consistently documented systolic blood pressures near 027 with diastolic pressures over 90.   The patient does have family history of hypertension.   There is no prior documented abdominal bruit. The patient occasionally has flushing symptoms but denies palpitations. No episodes of syncope.There is no history of headache. There is no history of flash pulmonary edema.  The patient denies a history of renal disease.  No recent shortening of the patient's walking distance or new symptoms consistent with claudication.  No history of rest pain symptoms. No new ulcers or wounds of the lower extremities have occurred.  The patient denies amaurosis fugax or recent TIA symptoms. There are no recent neurological changes noted. There is no history of DVT, PE or superficial thrombophlebitis. No recent episodes of angina or shortness of breath documented.   Duplex ultrasound of the renal arteries demonstrates ***  No outpatient medications have been marked as taking for the 04/05/22 encounter (Appointment) with Patricia Cowan, Patricia Lory, MD.    Past Medical History:  Diagnosis Date   Anxiety    Congenital single kidney    Dysthymia    Dysthymia    Hemorrhoid    HTN (hypertension)    Lichen sclerosus     Past Surgical History:  Procedure Laterality Date   ABDOMINAL HYSTERECTOMY     BREAST CYST ASPIRATION Left 2012   neg (performed 2 times)   COLONOSCOPY WITH PROPOFOL N/A 10/19/2019   Procedure: COLONOSCOPY WITH PROPOFOL;  Surgeon: Patricia Rubenstein, MD;  Location: [facility]  ENDOSCOPY;  Service: Endoscopy;  Laterality: N/A;   NASAL POLYP SURGERY      Social History Social History   Tobacco Use   Smoking status: Never   Smokeless tobacco: Never  Vaping Use   Vaping Use: Never used  Substance Use Topics   Alcohol use: Yes    Alcohol/week: 1.0 - 3.0 standard drink of alcohol    Types: 1 - 2 Cans of beer per week    Comment: socially   Drug use: No    Family History Family History  Problem Relation Age of Onset   Breast cancer Cousin 64       Malignant   Depression Mother    Depression Sister    ADD / ADHD Brother    Breast cancer Paternal Aunt 38       Malignant    Allergies  Allergen Reactions   Codeine Hives   Penicillins Rash     REVIEW OF SYSTEMS (Negative unless checked)  Constitutional: [] Weight loss  [] Fever  [] Chills Cardiac: [] Chest pain   [] Chest pressure   [] Palpitations   [] Shortness of breath when laying flat   [] Shortness of breath with exertion. Vascular:  [x] Pain in legs with walking   [] Pain in legs at rest  [] History of DVT   [] Phlebitis   [] Swelling in legs   [] Varicose veins   [] Non-healing ulcers Pulmonary:   [] Uses home oxygen   [] Productive cough   [] Hemoptysis   [] Wheeze  [] COPD   [] Asthma Neurologic:  [] Dizziness   []   Seizures   [] History of stroke   [] History of TIA  [] Aphasia   [] Vissual changes   [] Weakness or numbness in arm   [] Weakness or numbness in leg Musculoskeletal:   [] Joint swelling   [] Joint pain   [] Low back pain Hematologic:  [] Easy bruising  [] Easy bleeding   [] Hypercoagulable state   [] Anemic Gastrointestinal:  [] Diarrhea   [] Vomiting  [x] Gastroesophageal reflux/heartburn   [] Difficulty swallowing. Genitourinary:  [] Chronic kidney disease   [] Difficult urination  [] Frequent urination   [] Blood in urine Skin:  [] Rashes   [] Ulcers  Psychological:  [] History of anxiety   []  History of major depression.  Physical Examination  There were no vitals filed for this visit. There is no height or weight  on file to calculate BMI. Gen: WD/WN, NAD Head: Patricia Cowan/AT, No temporalis wasting.  Ear/Nose/Throat: Hearing grossly intact, nares w/o erythema or drainage Eyes: PER, EOMI, sclera nonicteric.  Neck: Supple, no masses.  No bruit or JVD.  Pulmonary:  Good air movement, no audible wheezing, no use of accessory muscles.  Cardiac: RRR, normal S1, S2, no Murmurs. Vascular:  mild trophic changes, no open wounds Vessel Right Left  Radial Palpable Palpable  PT Not Palpable Not Palpable  DP Not Palpable Not Palpable  Gastrointestinal: soft, non-distended. No guarding/no peritoneal signs.  Musculoskeletal: M/S 5/5 throughout.  No visible deformity.  Neurologic: CN 2-12 intact. Pain and light touch intact in extremities.  Symmetrical.  Speech is fluent. Motor exam as listed above. Psychiatric: Judgment intact, Mood & affect appropriate for pt's clinical situation. Dermatologic: No rashes or ulcers noted.  No changes consistent with cellulitis.   CBC Lab Results  Component Value Date   WBC 12.9 (H) 02/02/2016   HGB 16.8 (H) 02/02/2016   HCT 48.4 (H) 02/02/2016   MCV 93.2 02/02/2016   PLT 152 02/02/2016    BMET    Component Value Date/Time   NA 137 02/02/2016 1810   NA 139 11/20/2012 1555   K 4.1 02/02/2016 1810   K 3.8 11/20/2012 1555   CL 102 02/02/2016 1810   CL 106 11/20/2012 1555   CO2 24 02/02/2016 1810   CO2 24 11/20/2012 1555   GLUCOSE 119 (H) 02/02/2016 1810   GLUCOSE 113 (H) 11/20/2012 1555   BUN 20 02/02/2016 1810   BUN 17 11/20/2012 1555   CREATININE 1.00 02/10/2022 1551   CREATININE 1.14 11/20/2012 1555   CALCIUM 9.6 02/02/2016 1810   CALCIUM 10.2 (H) 11/20/2012 1555   GFRNONAA >60 02/02/2016 1810   GFRNONAA 51 (L) 11/20/2012 1555   GFRAA >60 02/02/2016 1810   GFRAA 60 (L) 11/20/2012 1555   CrCl cannot be calculated (Patient's most recent lab result is older than the maximum 21 days allowed.).  COAG No results found for: "INR", "PROTIME"  Radiology No results  found.   Assessment/Plan There are no diagnoses linked to this encounter.   Patricia Pilar, MD  04/02/2022 4:26 PM

## 2022-04-05 ENCOUNTER — Encounter (INDEPENDENT_AMBULATORY_CARE_PROVIDER_SITE_OTHER): Payer: Self-pay | Admitting: Vascular Surgery

## 2022-04-05 ENCOUNTER — Ambulatory Visit (INDEPENDENT_AMBULATORY_CARE_PROVIDER_SITE_OTHER): Payer: Medicare PPO | Admitting: Vascular Surgery

## 2022-04-05 VITALS — BP 142/83 | HR 50 | Resp 16 | Wt 159.8 lb

## 2022-04-05 DIAGNOSIS — M5417 Radiculopathy, lumbosacral region: Secondary | ICD-10-CM

## 2022-04-05 DIAGNOSIS — I701 Atherosclerosis of renal artery: Secondary | ICD-10-CM | POA: Diagnosis not present

## 2022-04-05 DIAGNOSIS — K219 Gastro-esophageal reflux disease without esophagitis: Secondary | ICD-10-CM

## 2022-04-05 DIAGNOSIS — I10 Essential (primary) hypertension: Secondary | ICD-10-CM

## 2022-04-05 DIAGNOSIS — I722 Aneurysm of renal artery: Secondary | ICD-10-CM | POA: Diagnosis not present

## 2022-04-06 ENCOUNTER — Encounter (INDEPENDENT_AMBULATORY_CARE_PROVIDER_SITE_OTHER): Payer: Self-pay | Admitting: Vascular Surgery

## 2022-04-06 DIAGNOSIS — M5417 Radiculopathy, lumbosacral region: Secondary | ICD-10-CM | POA: Insufficient documentation

## 2022-07-26 ENCOUNTER — Other Ambulatory Visit: Payer: Self-pay | Admitting: Family Medicine

## 2022-07-26 DIAGNOSIS — Z1231 Encounter for screening mammogram for malignant neoplasm of breast: Secondary | ICD-10-CM

## 2022-08-25 IMAGING — MG MM DIGITAL SCREENING BILAT W/ TOMO AND CAD
6 of 12 series · 6 of 36 positions shown · non-contrast
Comparison: Previous exam(s).

CLINICAL DATA: Screening.

EXAM:
DIGITAL SCREENING BILATERAL MAMMOGRAM WITH TOMOSYNTHESIS AND CAD
TECHNIQUE: Bilateral screening digital craniocaudal and mediolateral oblique
mammograms were obtained. Bilateral screening digital breast
tomosynthesis was performed. The images were evaluated with
computer-aided detection.

[L CC synth-2D]
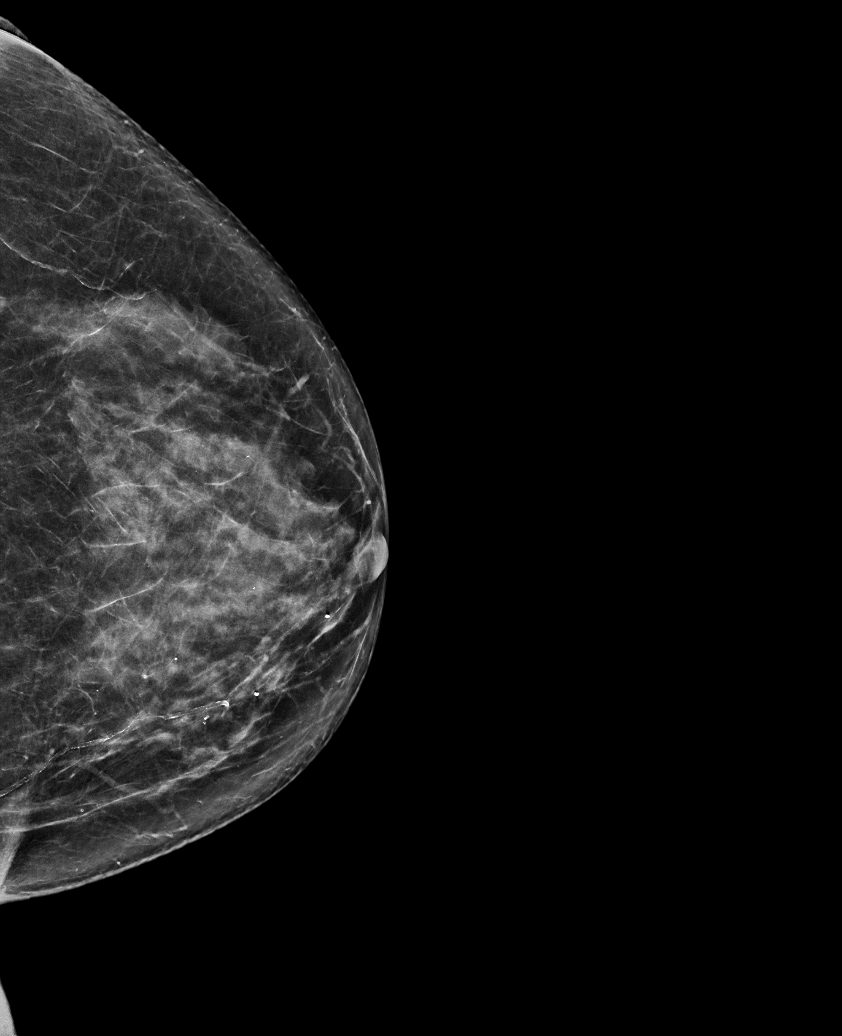

[L MLO synth-2D (1 of 2)]
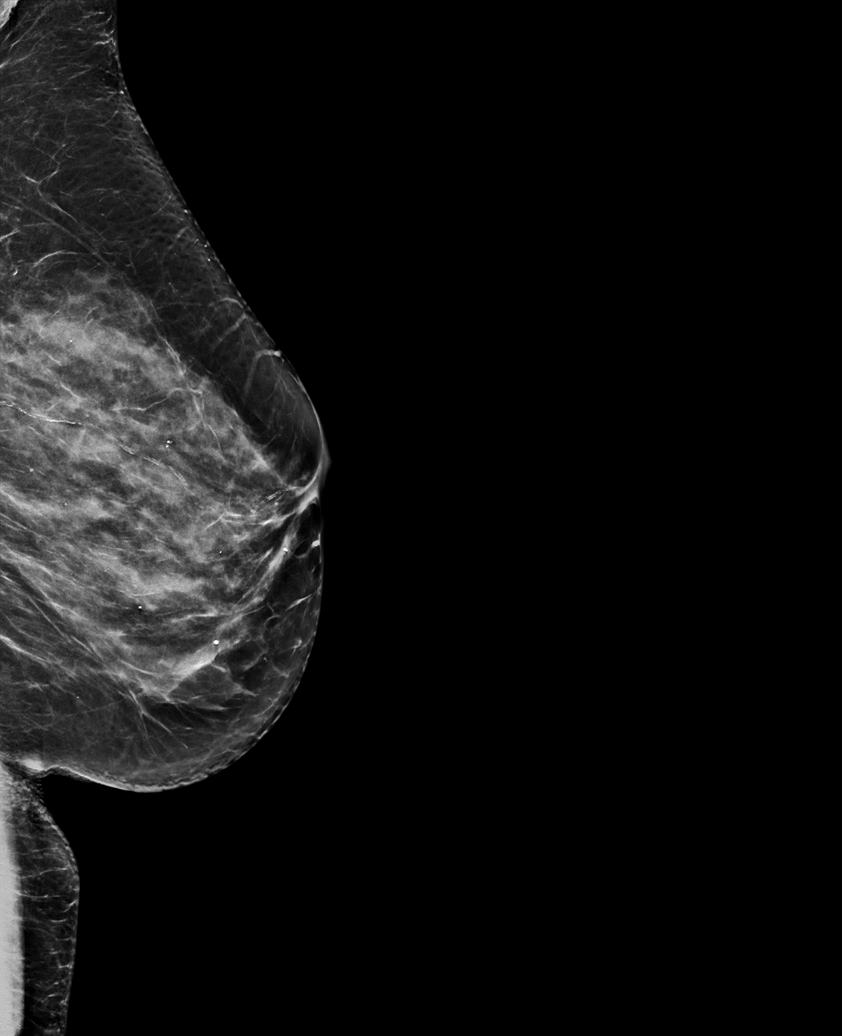

[L MLO synth-2D (2 of 2)]
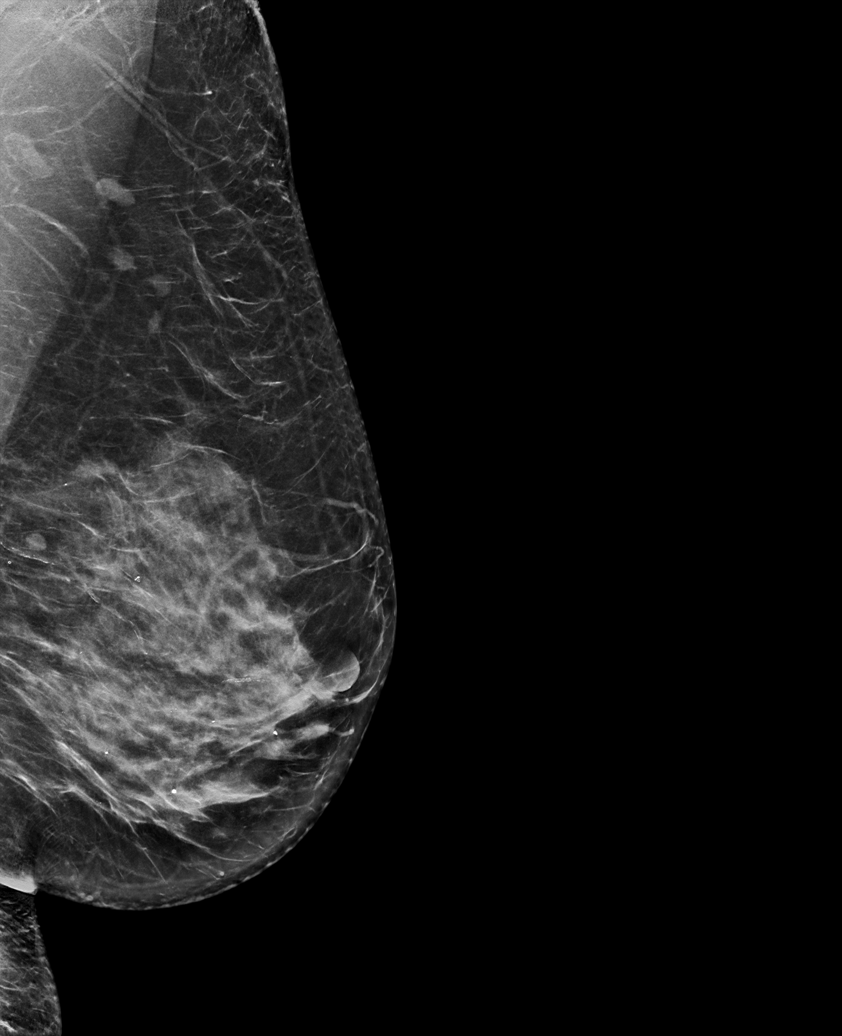

[R CC synth-2D]
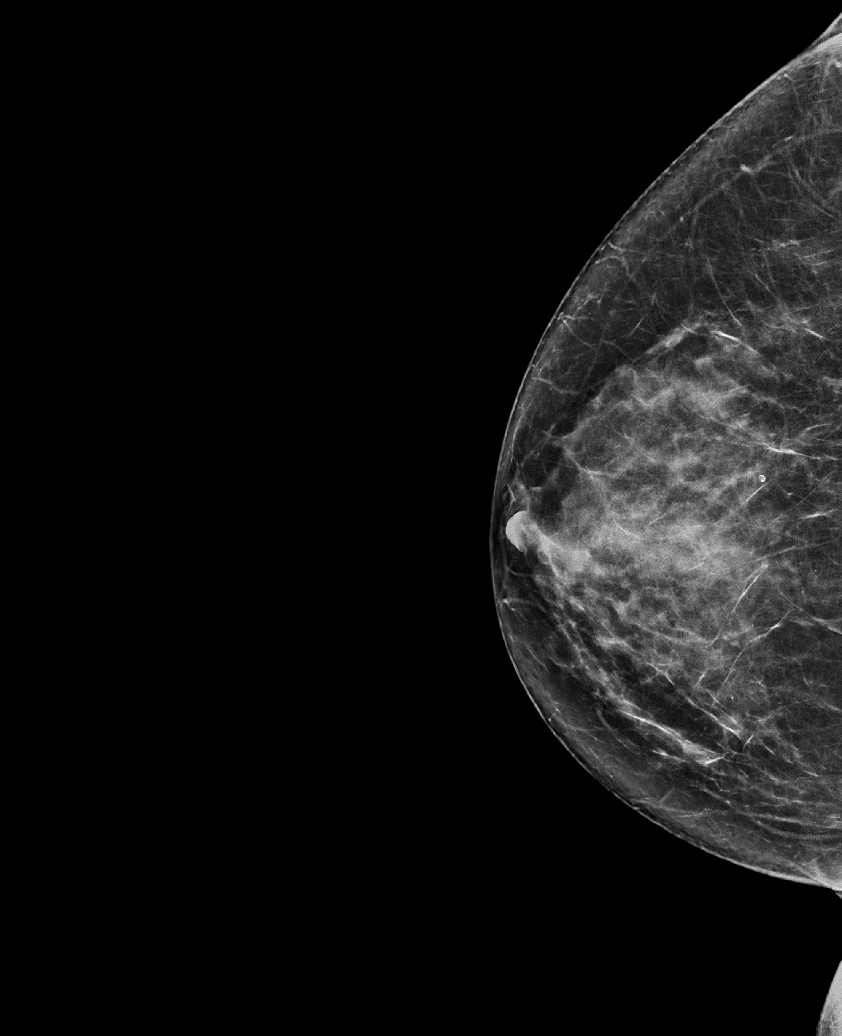

[R MLO synth-2D (1 of 2)]
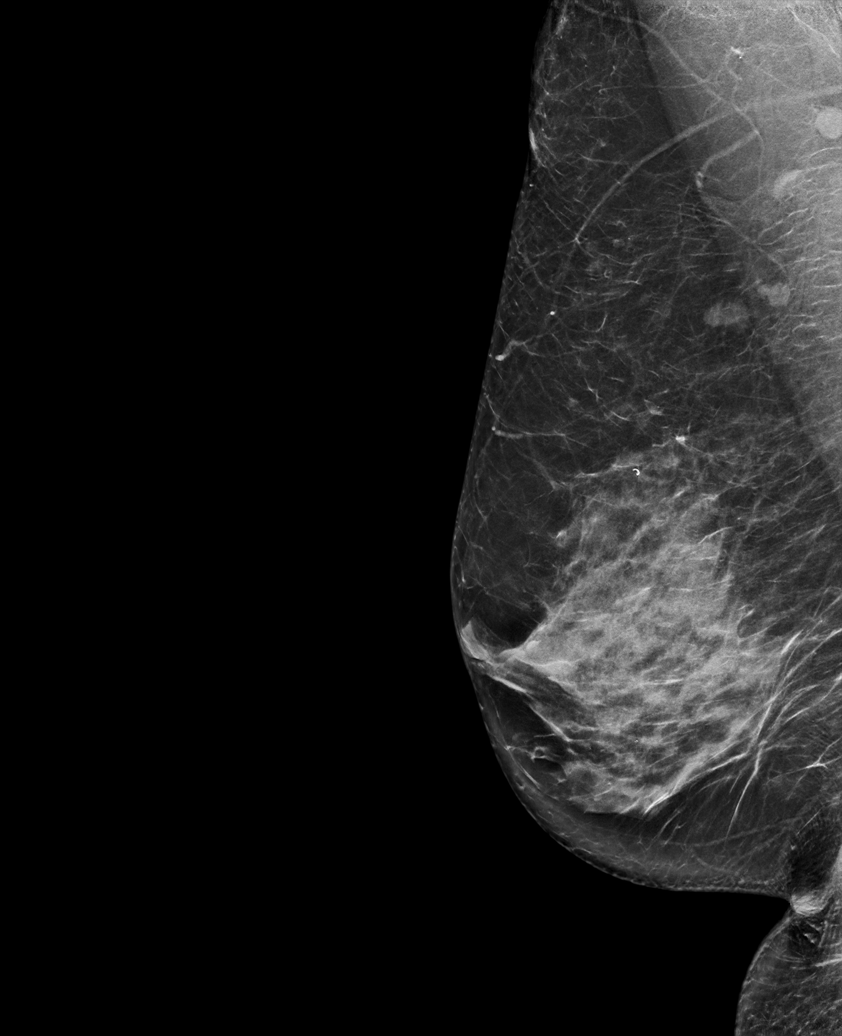

[R MLO synth-2D (2 of 2)]
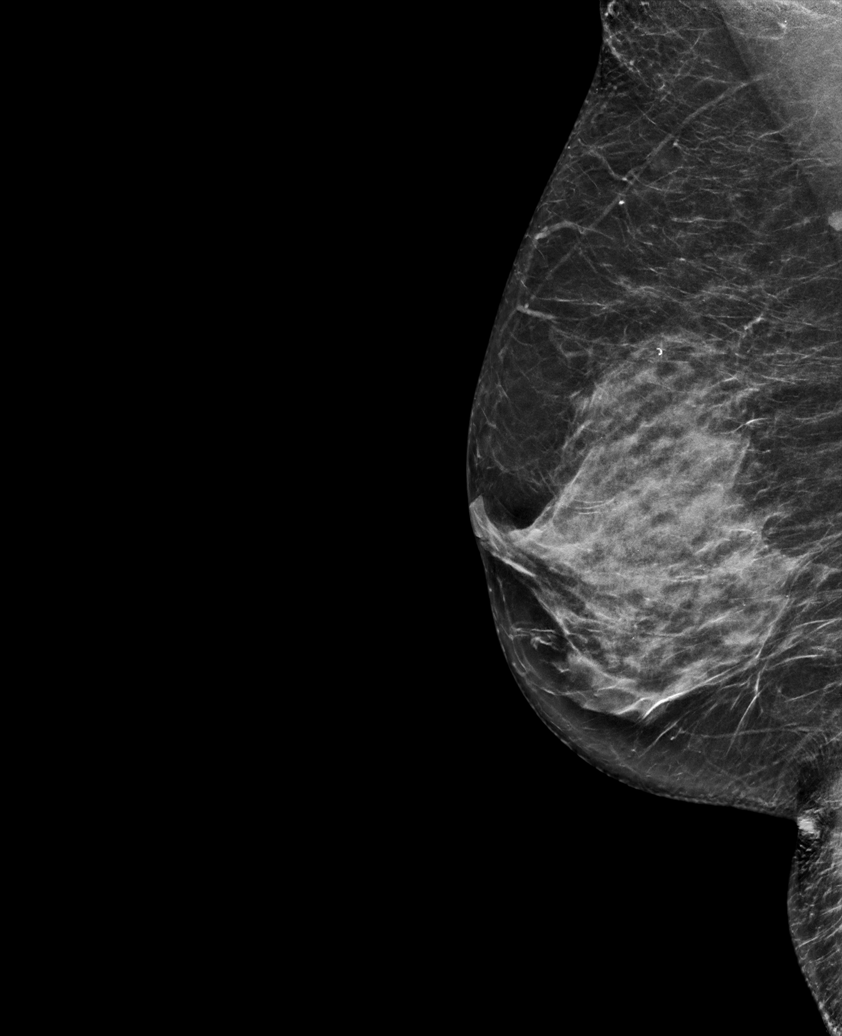

[6 of 36 positions shown; findings below may reference images not displayed]

ACR Breast Density Category c: The breast tissue is heterogeneously
dense, which may obscure small masses.
FINDINGS: There are no findings suspicious for malignancy.
IMPRESSION: No mammographic evidence of malignancy. A result letter of this
screening mammogram will be mailed directly to the patient.

RECOMMENDATION:
Screening mammogram in one year. (Code:Q3-W-BC3)

BI-RADS CATEGORY  1: Negative.

## 2022-08-29 ENCOUNTER — Encounter: Payer: Self-pay | Admitting: Emergency Medicine

## 2022-08-29 ENCOUNTER — Ambulatory Visit
Admission: EM | Admit: 2022-08-29 | Discharge: 2022-08-29 | Disposition: A | Discharge status: Home / Self Care | Payer: Medicare PPO | Attending: Urgent Care | Admitting: Urgent Care

## 2022-08-29 DIAGNOSIS — L237 Allergic contact dermatitis due to plants, except food: Secondary | ICD-10-CM | POA: Diagnosis not present

## 2022-08-29 MED ORDER — PREDNISONE 10 MG PO TABS: ORAL_TABLET | ORAL | 0 refills | Status: AC

## 2022-08-29 NOTE — Discharge Instructions (Signed)
Follow up here or with your primary care provider if your symptoms are worsening or not improving with treatment.     

## 2022-08-29 NOTE — ED Triage Notes (Signed)
Pt presents with poison ivy on her face and right arm x 1 week.

## 2022-08-29 NOTE — ED Provider Notes (Signed)
Patricia Cowan    CSN: [id number] Arrival date & time: 08/29/22  1220      History   Chief Complaint Chief Complaint  Patient presents with   Poison Ivy    HPI Patricia Cowan is a 72 y.o. female.    Poison Ivy    Patient presents with complaint of poison ivy rash on her face and right arm x 1 week.  She states that she has cleaned all of her clothing however new rashes are starting in new places.  She is concerned about the rash on her right cheek.  She states rashes are oozing clear fluid and very itchy.    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   Congenital absence of one kidney 10/09/2015   Dysthymia 10/09/2015   Hemorrhoid 10/09/2015   BP (high blood pressure) 10/09/2015    Past Surgical History:  Procedure Laterality Date   ABDOMINAL HYSTERECTOMY     BREAST CYST ASPIRATION Left 2012   neg (performed 2 times)   COLONOSCOPY WITH PROPOFOL N/A 10/19/2019   Procedure: COLONOSCOPY WITH PROPOFOL;  Surgeon: Regis Bill, MD;  Location: [facility] ENDOSCOPY;  Service: Endoscopy;  Laterality: N/A;   NASAL POLYP SURGERY      OB History     Gravida  4   Para  3   Term  3   Preterm      AB  1   Living  3      SAB  1   IAB      Ectopic      Multiple      Live Births  3            Home Medications    Prior to Admission medications   Medication Sig Start Date End Date Taking? Authorizing Provider  aspirin EC 81 MG tablet Take 81 mg by mouth daily.     [provider]  Aspirin-Calcium Carbonate 81-777 MG TABS Take by mouth. Patient not taking: Reported on 04/05/2022    [provider]  buPROPion (WELLBUTRIN XL) 150 MG 24 hr tablet Take 1 tablet by mouth daily. 02/04/20   [provider]  clobetasol ointment (TEMOVATE) 0.05 % Apply to affected area every night for 4 weeks, then every other day for 4 weeks and then twice a week for maintenance 01/12/18   Copland, Alicia B, PA-C  escitalopram (LEXAPRO) 20 MG tablet  09/23/15   [provider]  estradiol (ESTRACE) 0.1 MG/GM vaginal cream Place 1 Applicatorful vaginally as needed.    [provider]  glucosamine-chondroitin 500-400 MG tablet Take 1 tablet by mouth 3 (three) times daily.    [provider]  lisinopril (ZESTRIL) 20 MG tablet 20 mg. 07/07/15   [provider]  meloxicam (MOBIC) 15 MG tablet Take 15 mg by mouth daily.    [provider]  Methylsulfonylmethane (MSM) 750 MG CAPS Take by mouth.    [provider]  omeprazole (PRILOSEC)  20 MG capsule  08/19/15   [provider]  VESICARE 5 MG tablet  10/05/15   [provider]    Family History Family History  Problem Relation Age of Onset   Breast cancer Cousin 61       Malignant   Depression Mother    Depression Sister    ADD / ADHD Brother    Breast cancer Paternal Aunt 76       Malignant    Social History Social History   Tobacco Use   Smoking status: Never   Smokeless tobacco: Never  Vaping Use   Vaping Use: Never used  Substance Use Topics   Alcohol use: Yes    Alcohol/week: 1.0 - 3.0 standard drink of alcohol    Types: 1 - 2 Cans of beer per week    Comment: socially   Drug use: No     Allergies   Codeine and Penicillins   Review of Systems Review of Systems   Physical Exam Triage Vital Signs ED Triage Vitals  Enc Vitals Group     BP 08/29/22 1237 (!) 154/71     Pulse Rate 08/29/22 1237 (!) 52     Resp  08/29/22 1237 16     Temp 08/29/22 1237 98.3 F (36.8 C)     Temp Source 08/29/22 1237 Oral     SpO2 08/29/22 1237 99 %     Weight --      Height --      Head Circumference --      Peak Flow --      Pain Score 08/29/22 1238 0     Pain Loc --      Pain Edu? --      Excl. in GC? --    No data found.  Updated Vital Signs BP (!) 154/71 (BP Location: Left Arm)   Pulse (!) 52 Comment: Hx of low HR  Temp 98.3 F (36.8 C) (Oral)   Resp 16   SpO2 99%   Visual Acuity Right Eye Distance:   Left Eye Distance:   Bilateral Distance:    Right Eye Near:   Left Eye Near:    Bilateral Near:     Physical Exam Vitals reviewed.  Constitutional:      Appearance: Normal appearance.  HENT:     Head:   Musculoskeletal:       Arms:  Skin:    General: Skin is warm and dry.     Findings: Erythema and rash present.  Neurological:     General: No focal deficit present.     Mental Status: She is alert and oriented to person, place, and time.  Psychiatric:        Mood and Affect: Mood normal.        Behavior: Behavior normal.      UC Treatments / Results  Labs (all labs ordered are listed, but only abnormal results are displayed) Labs Reviewed - No data to display  EKG   Radiology No results found.  Procedures Procedures (including critical care time)  Medications Ordered in UC Medications - No data to display  Initial Impression / Assessment and Plan / UC Course  I have reviewed the triage vital signs and the nursing notes.  Pertinent labs & imaging results that were available during my care of the patient were reviewed by me and considered in my medical decision making (see chart for details).   Patricia Cowan is a 72 y.o. female  presenting with poison ivy dermatitis. Patient is afebrile without recent antipyretics, satting well on room air. Overall is well appearing though non-toxic, well hydrated, without respiratory distress.  Erythematous, vesicular rash with  clear fluid in multiple locations on her body.  Rash is present on her right cheek, right upper extremity.  Reviewed relevant chart history.   Poison ivy dermatitis.  Discussed treatment with topical steroids which the patient declined.  Requesting systemic treatment with oral prednisone which she has previously tolerated.  Discussed potential side effects not limited to hyperactivity, sleep disturbance, increased appetite.  Counseled patient on potential for adverse effects with medications prescribed/recommended today, ER and return-to-clinic precautions discussed, patient verbalized understanding and agreement with care plan.   Final Clinical Impressions(s) / UC Diagnoses   Final diagnoses:  None   Discharge Instructions   None    ED Prescriptions   None    PDMP not reviewed this encounter.   Charma Igo, [place] 08/29/22 1306

## 2022-09-01 ENCOUNTER — Ambulatory Visit
Admission: RE | Admit: 2022-09-01 | Discharge: 2022-09-01 | Disposition: A | Discharge status: Home / Self Care | Payer: Medicare PPO | Source: Ambulatory Visit | Attending: Family Medicine | Admitting: Family Medicine

## 2022-09-01 DIAGNOSIS — Z1231 Encounter for screening mammogram for malignant neoplasm of breast: Secondary | ICD-10-CM | POA: Diagnosis present

## 2023-04-05 NOTE — Progress Notes (Signed)
MRN : [id number]  Patricia Cowan is a 73 y.o. (Aug 04, 1950) female who presents with chief complaint of check circulation.  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 There have been no major interval changes in the patient's blood pressure control.  The patient denies any major changes in their medications.  The patient denies headache or flushing.  No flank or unusual back pain.    There have been no significant changes to the patient's overall health care.  She notes that she is only on 1 blood pressure medicine recently the dose did go up but this has been a relatively minor change.  No recent shortening of the patient's walking distance or new symptoms consistent with claudication.  No history of rest pain symptoms. No new ulcers or wounds of the lower extremities have occurred.  The patient denies amaurosis fugax or recent TIA symptoms. There are no recent neurological changes noted. There is no history of DVT, PE or superficial thrombophlebitis. No recent episodes of angina or shortness of breath documented.   Her renal function has remained stable.   No outpatient medications have been marked as taking for the 04/07/23 encounter (Appointment) with Gilda Crease, Latina Craver, MD.    Past Medical History:  Diagnosis Date   Anxiety    Congenital single kidney    Dysthymia    Dysthymia    Hemorrhoid    HTN (hypertension)    Lichen sclerosus     Past Surgical History:  Procedure Laterality Date   ABDOMINAL HYSTERECTOMY     BREAST CYST ASPIRATION Left 2012   neg (performed 2 times)   COLONOSCOPY WITH PROPOFOL N/A 10/19/2019   Procedure: COLONOSCOPY WITH PROPOFOL;  Surgeon: Regis Bill, MD;  Location: [facility] ENDOSCOPY;  Service: Endoscopy;  Laterality: N/A;   NASAL  POLYP SURGERY      Social History Social History   Tobacco Use   Smoking status: Never   Smokeless tobacco: Never  Vaping Use   Vaping status: Never Used  Substance Use Topics   Alcohol use: Yes    Alcohol/week: 1.0 - 3.0 standard drink of alcohol    Types: 1 - 2 Cans of beer per week    Comment: socially   Drug use: No    Family History Family History  Problem Relation Age of Onset   Breast cancer Cousin 4       Malignant   Depression Mother    Depression Sister    ADD / ADHD Brother    Breast cancer Paternal Aunt 41       Malignant    Allergies  Allergen Reactions   Codeine Hives   Penicillins Rash     REVIEW OF SYSTEMS (Negative unless checked)  Constitutional: [] Weight loss  [] Fever  [] Chills Cardiac: [] Chest pain   [] Chest pressure   [] Palpitations   [] Shortness of breath when laying flat   [] Shortness of breath with exertion. Vascular:  [x] Pain in legs with walking   [] Pain in  legs at rest  [] History of DVT   [] Phlebitis   [] Swelling in legs   [] Varicose veins   [] Non-healing ulcers Pulmonary:   [] Uses home oxygen   [] Productive cough   [] Hemoptysis   [] Wheeze  [] COPD   [] Asthma Neurologic:  [] Dizziness   [] Seizures   [] History of stroke   [] History of TIA  [] Aphasia   [] Vissual changes   [] Weakness or numbness in arm   [] Weakness or numbness in leg Musculoskeletal:   [] Joint swelling   [] Joint pain   [] Low back pain Hematologic:  [] Easy bruising  [] Easy bleeding   [] Hypercoagulable state   [] Anemic Gastrointestinal:  [] Diarrhea   [] Vomiting  [x] Gastroesophageal reflux/heartburn   [] Difficulty swallowing. Genitourinary:  [] Chronic kidney disease   [] Difficult urination  [] Frequent urination   [] Blood in urine Skin:  [] Rashes   [] Ulcers  Psychological:  [] History of anxiety   []  History of major depression.  Physical Examination  There were no vitals filed for this visit. There is no height or weight on file to calculate BMI. Gen: WD/WN, NAD Head: [place]/AT,  No temporalis wasting.  Ear/Nose/Throat: Hearing grossly intact, nares w/o erythema or drainage Eyes: PER, EOMI, sclera nonicteric.  Neck: Supple, no masses.  No bruit or JVD.  Pulmonary:  Good air movement, no audible wheezing, no use of accessory muscles.  Cardiac: RRR, normal S1, S2, no Murmurs. Vascular:  mild trophic changes, no open wounds Vessel Right Left  Radial Palpable Palpable  Gastrointestinal: soft, non-distended. No guarding/no peritoneal signs.  Musculoskeletal: M/S 5/5 throughout.  No visible deformity.  Neurologic: CN 2-12 intact. Pain and light touch intact in extremities.  Symmetrical.  Speech is fluent. Motor exam as listed above. Psychiatric: Judgment intact, Mood & affect appropriate for pt's clinical situation. Dermatologic: No rashes or ulcers noted.  No changes consistent with cellulitis.   CBC Lab Results  Component Value Date   WBC 12.9 (H) 02/02/2016   HGB 16.8 (H) 02/02/2016   HCT 48.4 (H) 02/02/2016   MCV 93.2 02/02/2016   PLT 152 02/02/2016    BMET    Component Value Date/Time   NA 137 02/02/2016 1810   NA 139 11/20/2012 1555   K 4.1 02/02/2016 1810   K 3.8 11/20/2012 1555   CL 102 02/02/2016 1810   CL 106 11/20/2012 1555   CO2 24 02/02/2016 1810   CO2 24 11/20/2012 1555   GLUCOSE 119 (H) 02/02/2016 1810   GLUCOSE 113 (H) 11/20/2012 1555   BUN 20 02/02/2016 1810   BUN 17 11/20/2012 1555   CREATININE 1.00 02/10/2022 1551   CREATININE 1.14 11/20/2012 1555   CALCIUM 9.6 02/02/2016 1810   CALCIUM 10.2 (H) 11/20/2012 1555   GFRNONAA >60 02/02/2016 1810   GFRNONAA 51 (L) 11/20/2012 1555   GFRAA >60 02/02/2016 1810   GFRAA 60 (L) 11/20/2012 1555   CrCl cannot be calculated (Patient's most recent lab result is older than the maximum 21 days allowed.).  COAG No results found for: "INR", "PROTIME"  Radiology No results found.   Assessment/Plan 1. Renal artery aneurysm (HCC) (Primary) Recommend:  Patient has 2 small renal artery  aneurysms.  They are calcified and relatively distal.  At this point she is beyond childbearing years and therefore there is no indication for treatment at this time.  She is not having profound problems with hypertension requiring numerous changes and additions of medications.  Therefore there is no indication for intervention regarding the aneurysms.  She will follow up with me annually with  a CT scan of the abdomen and pelvis.   - VAS US RENAL ARTERY DUPLEX; Future  2. Primary hypertension Continue antihypertensive medications as already ordered, these medications have been reviewed and there are no changes at this time.  3. Gastroesophageal reflux disease, unspecified whether esophagitis present Continue PPI as already ordered, this medication has been reviewed and there are no changes at this time.  Avoidence of caffeine and alcohol  Moderate elevation of the head of the bed   4. Radicular pain of lumbosacral region Continue medications to treat the patient's degenerative disease as already ordered, these medications have been reviewed and there are no changes at this time.  Continued activity and therapy was stressed.    Levora Dredge, MD  04/05/2023 9:46 AM

## 2023-04-07 ENCOUNTER — Encounter (INDEPENDENT_AMBULATORY_CARE_PROVIDER_SITE_OTHER): Payer: Self-pay | Admitting: Vascular Surgery

## 2023-04-07 ENCOUNTER — Ambulatory Visit (INDEPENDENT_AMBULATORY_CARE_PROVIDER_SITE_OTHER): Payer: Medicare PPO | Admitting: Vascular Surgery

## 2023-04-07 VITALS — BP 150/77 | HR 60 | Resp 16 | Wt 159.0 lb

## 2023-04-07 DIAGNOSIS — I10 Essential (primary) hypertension: Secondary | ICD-10-CM | POA: Diagnosis not present

## 2023-04-07 DIAGNOSIS — I722 Aneurysm of renal artery: Secondary | ICD-10-CM

## 2023-04-07 DIAGNOSIS — M5417 Radiculopathy, lumbosacral region: Secondary | ICD-10-CM

## 2023-04-07 DIAGNOSIS — K219 Gastro-esophageal reflux disease without esophagitis: Secondary | ICD-10-CM

## 2023-05-25 ENCOUNTER — Other Ambulatory Visit: Payer: Self-pay | Admitting: Family Medicine

## 2023-05-25 DIAGNOSIS — M5416 Radiculopathy, lumbar region: Secondary | ICD-10-CM

## 2023-05-31 ENCOUNTER — Ambulatory Visit
Admission: RE | Admit: 2023-05-31 | Discharge: 2023-05-31 | Disposition: A | Discharge status: Home / Self Care | Source: Ambulatory Visit | Attending: Family Medicine | Admitting: Family Medicine

## 2023-05-31 DIAGNOSIS — M5416 Radiculopathy, lumbar region: Secondary | ICD-10-CM

## 2023-08-09 ENCOUNTER — Encounter (INDEPENDENT_AMBULATORY_CARE_PROVIDER_SITE_OTHER): Payer: Self-pay

## 2023-08-11 ENCOUNTER — Other Ambulatory Visit: Payer: Self-pay | Admitting: Family Medicine

## 2023-08-11 DIAGNOSIS — Z1231 Encounter for screening mammogram for malignant neoplasm of breast: Secondary | ICD-10-CM

## 2023-09-05 ENCOUNTER — Encounter

## 2023-09-09 ENCOUNTER — Ambulatory Visit
Admission: RE | Admit: 2023-09-09 | Discharge: 2023-09-09 | Disposition: A | Discharge status: Home / Self Care | Source: Ambulatory Visit | Attending: Family Medicine | Admitting: Family Medicine

## 2023-09-09 DIAGNOSIS — Z1231 Encounter for screening mammogram for malignant neoplasm of breast: Secondary | ICD-10-CM | POA: Insufficient documentation

## 2024-04-03 NOTE — Progress Notes (Unsigned)
 "                                                                      MRN : [id number]  Patricia Cowan is a 74 y.o. (Jul 26, 1950) female who presents with chief complaint of check circulation.  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 There have been no major interval changes in the patient's overall health.  The patient denies any major changes in blood pressure medications although she is now dividing her meds between morning and night.  The patient denies headache or flushing.  No flank or unusual back pain.  She continues to workout on a routine basis.   No recent shortening of the patient's walking distance or new symptoms consistent with claudication.  No history of rest pain symptoms. No new ulcers or wounds of the lower extremities have occurred.   The patient denies amaurosis fugax or recent TIA symptoms. There are no recent neurological changes noted. There is no history of DVT, PE or superficial thrombophlebitis. No recent episodes of angina or shortness of breath documented.   CT angiogram dated 02/10/2022 demonstrates 2 renal artery aneurysms in the distal left renal artery 1 measuring 1.2 cm and 1 measuring 0.7 cm   Her renal function has remained stable.(Lab 07/07/2023 BUN/Cr 13/1.1)  Duplex ultrasound of the renal arteries again demonstrates the atrophic right kidney.  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  The aneurysm is identified and measures 0.9 cm.  Second aneurysm appears thrombosed.  No increase in size of the aneurysm.  Active Medications[1]  Past Medical History:  Diagnosis Date   Anxiety    Congenital single kidney    Dysthymia    Dysthymia    Hemorrhoid    HTN (hypertension)    Lichen sclerosus     Past Surgical History:  Procedure  Laterality Date   ABDOMINAL HYSTERECTOMY     BREAST CYST ASPIRATION Left 2012   neg (performed 2 times)   COLONOSCOPY WITH PROPOFOL  N/A 10/19/2019   Procedure: COLONOSCOPY WITH PROPOFOL ;  Surgeon: Maryruth Ole ONEIDA, MD;  Location: [facility] END[facility]COPY;  Service: Endoscopy;  Laterality: N/A;   NASAL POLYP SURGERY      Social History Social History[2]  Family History Family History  Problem Relation Age of Onset   Breast cancer Cousin 77       Malignant   Depression Mother    Depression Sister    ADD / ADHD Brother    Breast cancer Paternal Aunt 69       Malignant    Allergies[3]   REVIEW OF SYSTEMS (Negative unless checked)  Constitutional: [] Weight loss  [] Fever  [] Chills Cardiac: [] Chest pain   [] Chest pressure   [] Palpitations   [] Shortness of breath when laying flat   [] Shortness of breath with exertion. Vascular:  [x] Pain in legs with walking   [] Pain in legs at rest  [] History of DVT   [] Phlebitis   [] Swelling in legs   [] Varicose veins   [] Non-healing ulcers Pulmonary:   [] Uses home oxygen   [] Productive cough   []   Hemoptysis   [] Wheeze  [] COPD   [] Asthma Neurologic:  [] Dizziness   [] Seizures   [] History of stroke   [] History of TIA  [] Aphasia   [] Vissual changes   [] Weakness or numbness in arm   [] Weakness or numbness in leg Musculoskeletal:   [] Joint swelling   [] Joint pain   [] Low back pain Hematologic:  [] Easy bruising  [] Easy bleeding   [] Hypercoagulable state   [] Anemic Gastrointestinal:  [] Diarrhea   [] Vomiting  [] Gastroesophageal reflux/heartburn   [] Difficulty swallowing. Genitourinary:  [] Chronic kidney disease   [] Difficult urination  [] Frequent urination   [] Blood in urine Skin:  [] Rashes   [] Ulcers  Psychological:  [] History of anxiety   []  History of major depression.  Physical Examination  There were no vitals filed for this visit. There is no height or weight on file to calculate BMI. Gen: WD/WN, NAD Head: [place]/AT, No temporalis wasting.  Ear/Nose/Throat:  Hearing grossly intact, nares w/o erythema or drainage Eyes: PER, EOMI, sclera nonicteric.  Neck: Supple, no masses.  No bruit or JVD.  Pulmonary:  Good air movement, no audible wheezing, no use of accessory muscles.  Cardiac: RRR, normal S1, S2, no Murmurs. Vascular:   Vessel Right Left  Radial Palpable Palpable  Gastrointestinal: soft, non-distended. No guarding/no peritoneal signs.  Musculoskeletal: M/S 5/5 throughout.  No visible deformity.  Neurologic: CN 2-12 intact. Pain and light touch intact in extremities.  Symmetrical.  Speech is fluent. Motor exam as listed above. Psychiatric: Judgment intact, Mood & affect appropriate for pt's clinical situation. Dermatologic: No rashes or ulcers noted.  No changes consistent with cellulitis.   CBC Lab Results  Component Value Date   WBC 12.9 (H) 02/02/2016   HGB 16.8 (H) 02/02/2016   HCT 48.4 (H) 02/02/2016   MCV 93.2 02/02/2016   PLT 152 02/02/2016    BMET    Component Value Date/Time   NA 137 02/02/2016 1810   NA 139 11/20/2012 1555   K 4.1 02/02/2016 1810   K 3.8 11/20/2012 1555   CL 102 02/02/2016 1810   CL 106 11/20/2012 1555   CO2 24 02/02/2016 1810   CO2 24 11/20/2012 1555   GLUCOSE 119 (H) 02/02/2016 1810   GLUCOSE 113 (H) 11/20/2012 1555   BUN 20 02/02/2016 1810   BUN 17 11/20/2012 1555   CREATININE 1.00 02/10/2022 1551   CREATININE 1.14 11/20/2012 1555   CALCIUM 9.6 02/02/2016 1810   CALCIUM 10.2 (H) 11/20/2012 1555   GFRNONAA >60 02/02/2016 1810   GFRNONAA 51 (L) 11/20/2012 1555   GFRAA >60 02/02/2016 1810   GFRAA 60 (L) 11/20/2012 1555   CrCl cannot be calculated (Patient's most recent lab result is older than the maximum 21 days allowed.).  COAG No results found for: INR, PROTIME  Radiology No results found.   Assessment/Plan 1. Renal artery aneurysm (Primary) Recommend:   Patient has 2 small renal artery aneurysms.  They are calcified and relatively distal.  Duplex ultrasound demonstrates  the 1 aneurysm is now thrombosed and is no longer an issue the other aneurysm has not increased in size.  No intervention is indicated.  At this point she is beyond childbearing years and therefore there is no indication for treatment at this time.  She is not having profound problems with hypertension requiring numerous changes and additions of medications.  Therefore there is no indication for intervention regarding the aneurysms.  She will follow up with me annually with a duplex ultrasound  - VAS US  AORTA/IVC/ILIACS; Future  2.  Primary hypertension Continue antihypertensive medications as already ordered, these medications have been reviewed and there are no changes at this time.  3. Gastroesophageal reflux disease, unspecified whether esophagitis present Continue medications as already ordered, this medication has been reviewed and there are no changes at this time.  Avoidence of caffeine and alcohol  Moderate elevation of the head of the bed      Cordella Shawl, MD  04/03/2024 11:58 AM      [1]  No outpatient medications have been marked as taking for the 04/05/24 encounter (Appointment) with Shawl, Cordella MATSU, MD.  [2]  Social History Tobacco Use   Smoking status: Never   Smokeless tobacco: Never  Vaping Use   Vaping status: Never Used  Substance Use Topics   Alcohol use: Yes    Alcohol/week: 1.0 - 3.0 standard drink of alcohol    Types: 1 - 2 Cans of beer per week    Comment: socially   Drug use: No  [3]  Allergies Allergen Reactions   Codeine Hives   Penicillins Rash   "

## 2024-04-05 ENCOUNTER — Encounter (INDEPENDENT_AMBULATORY_CARE_PROVIDER_SITE_OTHER): Payer: Self-pay | Admitting: Vascular Surgery

## 2024-04-05 ENCOUNTER — Ambulatory Visit (INDEPENDENT_AMBULATORY_CARE_PROVIDER_SITE_OTHER): Payer: Medicare PPO

## 2024-04-05 ENCOUNTER — Ambulatory Visit (INDEPENDENT_AMBULATORY_CARE_PROVIDER_SITE_OTHER): Payer: Medicare PPO | Admitting: Vascular Surgery

## 2024-04-05 VITALS — BP 133/73 | HR 52 | Resp 17 | Ht 63.0 in | Wt 155.0 lb

## 2024-04-05 DIAGNOSIS — K219 Gastro-esophageal reflux disease without esophagitis: Secondary | ICD-10-CM | POA: Diagnosis not present

## 2024-04-05 DIAGNOSIS — I10 Essential (primary) hypertension: Secondary | ICD-10-CM | POA: Diagnosis not present

## 2024-04-05 DIAGNOSIS — I722 Aneurysm of renal artery: Secondary | ICD-10-CM

## 2024-04-20 ENCOUNTER — Ambulatory Visit: Admitting: Podiatry

## 2024-04-20 DIAGNOSIS — M79674 Pain in right toe(s): Secondary | ICD-10-CM

## 2024-04-20 DIAGNOSIS — B351 Tinea unguium: Secondary | ICD-10-CM | POA: Diagnosis not present

## 2024-04-20 DIAGNOSIS — M79675 Pain in left toe(s): Secondary | ICD-10-CM

## 2024-04-20 MED ORDER — TERBINAFINE HCL 250 MG PO TABS: 250.0000 mg | ORAL_TABLET | Freq: Every day | ORAL | 0 refills | Status: AC

## 2024-04-20 NOTE — Progress Notes (Signed)
" ° °  Chief Complaint  Patient presents with   Nail Problem    Pt is here due to toenail fungus.    Subjective: 74 y.o. female presenting today for evaluation of discoloration with thickening and sensitivity to the right toenails.  She has tried multiple different topical antifungal medications with no improvement.   Past Medical History:  Diagnosis Date   Anxiety    Congenital single kidney    Dysthymia    Dysthymia    Hemorrhoid    HTN (hypertension)    Lichen sclerosus     Past Surgical History:  Procedure Laterality Date   ABDOMINAL HYSTERECTOMY     BREAST CYST ASPIRATION Left 2012   neg (performed 2 times)   COLONOSCOPY WITH PROPOFOL  N/A 10/19/2019   Procedure: COLONOSCOPY WITH PROPOFOL ;  Surgeon: Maryruth Ole DASEN, MD;  Location: [facility] END[facility]COPY;  Service: Endoscopy;  Laterality: N/A;   NASAL POLYP SURGERY      Allergies[1]   RT foot 04/20/2024  Objective: Physical Exam General: The patient is alert and oriented x3 in no acute distress.  Dermatology: Hyperkeratotic, discolored, thickened, onychodystrophy noted. Skin is warm, dry and supple bilateral lower extremities. Negative for open lesions or macerations.  Vascular: Palpable pedal pulses bilaterally. No edema or erythema noted. Capillary refill within normal limits.  Neurological: Grossly intact via light touch  Musculoskeletal Exam: No pedal deformity noted  Assessment: #1 Onychomycosis of toenails  Plan of Care:  #1 Patient was evaluated. #2  Today we discussed different treatment options including oral, topical, and laser antifungal treatment modalities.  We discussed their efficacies and side effects.  Patient opts for oral antifungal treatment modality #3 prescription for Lamisil  250 mg #90 daily. Pt denies a history of liver pathology or symptoms.  CMP 07/07/2023 hepatic function WNL #4 return to clinic 6 months   Thresa EMERSON Sar, DPM [facility]  Dr. Thresa EMERSON Sar, DPM    [address] [place], [place] [address]                Office [phone]  Fax [phone]        [1]  Allergies Allergen Reactions   Codeine Hives   Penicillins Rash   "

## 2024-10-26 ENCOUNTER — Ambulatory Visit: Admitting: Podiatry

## 2025-04-04 ENCOUNTER — Ambulatory Visit (INDEPENDENT_AMBULATORY_CARE_PROVIDER_SITE_OTHER): Admitting: Vascular Surgery

## 2025-04-04 ENCOUNTER — Encounter (INDEPENDENT_AMBULATORY_CARE_PROVIDER_SITE_OTHER)
